# Patient Record
Sex: Male | Born: 1960
Health system: Southern US, Community
[De-identification: ages and names within clinical notes are randomized; demographics above are authoritative.]

## PROBLEM LIST (undated history)

## (undated) DIAGNOSIS — E119 Type 2 diabetes mellitus without complications: Secondary | ICD-10-CM

## (undated) DIAGNOSIS — F1910 Other psychoactive substance abuse, uncomplicated: Secondary | ICD-10-CM

## (undated) DIAGNOSIS — I10 Essential (primary) hypertension: Secondary | ICD-10-CM

## (undated) DIAGNOSIS — F1010 Alcohol abuse, uncomplicated: Secondary | ICD-10-CM

## (undated) DIAGNOSIS — D571 Sickle-cell disease without crisis: Secondary | ICD-10-CM

## (undated) HISTORY — PX: HYDROCELE EXCISION / REPAIR: SUR1145

## (undated) HISTORY — PX: BACK SURGERY: SHX140

---

## 2010-06-14 ENCOUNTER — Emergency Department (HOSPITAL_COMMUNITY)
Admission: EM | Admit: 2010-06-14 | Discharge: 2010-06-14 | Payer: Self-pay | Source: Home / Self Care | Admitting: Emergency Medicine

## 2010-06-17 LAB — BASIC METABOLIC PANEL
BUN: 6 mg/dL (ref 6–23)
CO2: 26 mEq/L (ref 19–32)
Calcium: 9.2 mg/dL (ref 8.4–10.5)
Chloride: 104 mEq/L (ref 96–112)
Creatinine, Ser: 0.9 mg/dL (ref 0.4–1.5)
GFR calc Af Amer: >60 mL/min (ref >60)
GFR calc non Af Amer: >60 mL/min (ref >60)
Glucose, Bld: 303 mg/dL — ABNORMAL HIGH (ref 70–99)
Potassium: 4.1 mEq/L (ref 3.5–5.1)
Sodium: 140 mEq/L (ref 135–145)

## 2010-06-17 LAB — GLUCOSE, CAPILLARY
Glucose-Capillary: 325 mg/dL — ABNORMAL HIGH (ref 70–99)
Glucose-Capillary: 184 mg/dL — ABNORMAL HIGH (ref 70–99)

## 2013-03-17 ENCOUNTER — Emergency Department (HOSPITAL_COMMUNITY)
Admission: EM | Admit: 2013-03-17 | Discharge: 2013-03-17 | Disposition: A | Discharge status: Home / Self Care | Payer: No Typology Code available for payment source | Attending: Emergency Medicine | Admitting: Emergency Medicine

## 2013-03-17 ENCOUNTER — Encounter (HOSPITAL_COMMUNITY): Payer: Self-pay | Admitting: Emergency Medicine

## 2013-03-17 ENCOUNTER — Emergency Department (HOSPITAL_COMMUNITY): Payer: No Typology Code available for payment source

## 2013-03-17 DIAGNOSIS — Y9389 Activity, other specified: Secondary | ICD-10-CM | POA: Insufficient documentation

## 2013-03-17 DIAGNOSIS — S0993XA Unspecified injury of face, initial encounter: Secondary | ICD-10-CM | POA: Insufficient documentation

## 2013-03-17 DIAGNOSIS — IMO0002 Reserved for concepts with insufficient information to code with codable children: Secondary | ICD-10-CM | POA: Insufficient documentation

## 2013-03-17 DIAGNOSIS — Z79899 Other long term (current) drug therapy: Secondary | ICD-10-CM | POA: Insufficient documentation

## 2013-03-17 DIAGNOSIS — E119 Type 2 diabetes mellitus without complications: Secondary | ICD-10-CM | POA: Insufficient documentation

## 2013-03-17 DIAGNOSIS — F17200 Nicotine dependence, unspecified, uncomplicated: Secondary | ICD-10-CM | POA: Insufficient documentation

## 2013-03-17 DIAGNOSIS — Y92410 Unspecified street and highway as the place of occurrence of the external cause: Secondary | ICD-10-CM | POA: Insufficient documentation

## 2013-03-17 HISTORY — DX: Type 2 diabetes mellitus without complications: E11.9

## 2013-03-17 MED ORDER — CYCLOBENZAPRINE HCL 10 MG PO TABS: 10.0000 mg | ORAL_TABLET | Freq: Two times a day (BID) | ORAL | Status: DC | PRN

## 2013-03-17 MED ORDER — IBUPROFEN 600 MG PO TABS: 600.0000 mg | ORAL_TABLET | Freq: Four times a day (QID) | ORAL | Status: DC | PRN

## 2013-03-17 MED ORDER — IBUPROFEN 400 MG PO TABS
600.0000 mg | ORAL_TABLET | Freq: Once | ORAL | Status: AC
  Administered 2013-03-17: 600 mg via ORAL
  Filled 2013-03-17 (×2): qty 1
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
## 2013-03-17 NOTE — ED Notes (Signed)
To x-ray

## 2013-03-17 NOTE — ED Notes (Signed)
The pt returned from xray 
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
## 2013-03-22 NOTE — ED Provider Notes (Signed)
Medical screening examination/treatment/procedure(s) were performed by non-physician practitioner and as supervising physician I was immediately available for consultation/collaboration.  Trinka Keshishyan Y. Dagan Heinz, MD 03/22/13 0043 

## 2013-07-22 ENCOUNTER — Emergency Department (HOSPITAL_COMMUNITY)
Admission: EM | Admit: 2013-07-22 | Discharge: 2013-07-22 | Disposition: A | Discharge status: Home / Self Care | Payer: Medicaid - Out of State | Attending: Emergency Medicine | Admitting: Emergency Medicine

## 2013-07-22 ENCOUNTER — Encounter (HOSPITAL_COMMUNITY): Payer: Self-pay | Admitting: Emergency Medicine

## 2013-07-22 DIAGNOSIS — R209 Unspecified disturbances of skin sensation: Secondary | ICD-10-CM | POA: Insufficient documentation

## 2013-07-22 DIAGNOSIS — Z79899 Other long term (current) drug therapy: Secondary | ICD-10-CM | POA: Insufficient documentation

## 2013-07-22 DIAGNOSIS — F17200 Nicotine dependence, unspecified, uncomplicated: Secondary | ICD-10-CM | POA: Insufficient documentation

## 2013-07-22 DIAGNOSIS — M79609 Pain in unspecified limb: Secondary | ICD-10-CM | POA: Insufficient documentation

## 2013-07-22 DIAGNOSIS — M79676 Pain in unspecified toe(s): Secondary | ICD-10-CM

## 2013-07-22 DIAGNOSIS — E119 Type 2 diabetes mellitus without complications: Secondary | ICD-10-CM | POA: Insufficient documentation

## 2013-07-22 LAB — CBG MONITORING, ED
Glucose-Capillary: 133 mg/dL — ABNORMAL HIGH (ref 70–99)
Glucose-Capillary: 151 mg/dL — ABNORMAL HIGH (ref 70–99)

## 2013-07-22 MED ORDER — METFORMIN HCL 500 MG PO TABS: 500.0000 mg | ORAL_TABLET | Freq: Two times a day (BID) | ORAL | Status: DC

## 2013-07-22 MED ORDER — TRAMADOL HCL 50 MG PO TABS: 50.0000 mg | ORAL_TABLET | Freq: Four times a day (QID) | ORAL | Status: DC | PRN

## 2013-07-22 NOTE — ED Notes (Signed)
Pt reports his R great toe has been hurting and hes worried its due to his diabetes. He ran out of his metformin last month.

## 2013-07-22 NOTE — ED Provider Notes (Signed)
CSN: [id number]     Arrival date & time 07/22/13  1026 History  This chart was scribed for non-physician practitioner, Renne CriglerJoshua Homar Weinkauf, PA-C, working with No att. providers found by Marica OtterNusrat Rahman, ED Scribe. This patient was seen in room TR05C/TR05C and the patient's care was started at 11:08 AM.   Chief Complaint  Patient presents with  . Toe Pain   The history is provided by the patient. No language interpreter was used.    HPI Comments: Stephen Bird is a 53 y.o. male who presents to the Emergency Department complaining of intermittent right great toe pain directly underneath the toenail that spreads to the tip of the toe. Pt describes pain as a "pins and needles" sensation. He states that he has had this pain for the past year. He reports that episodes can vary in length but usually last no more than 1 minute. He denies any recent injuries to his foot. He denies numbness or weakness in his right foot. There is no redness or drainage from the affected area. Patient denies fever. He has been taking his sister's gabapentin which does help.   Patient is also diabetic. Patient is currently on metformin for diabetes. However, patient has not been taking his metformin because he ran out of it last month. In its place, he has been taking Lantus which he has used in past. He has only been using lantus intermittently.   Pt. states he has been checking his sugar level at home. He reports he had sugar readings of 299 and 157 yesterday. Further, he reports that his sugar level was 160 a few days ago. He says that recently he had a low sugar of 60 after taking a dose of lantus and working for several hours, but he ate some candy and his sugar improved to 90. He is current smoker.  Past Medical History  Diagnosis Date  . Diabetes mellitus without complication    History reviewed. No pertinent past surgical history. History reviewed. No pertinent family history. History  Substance Use Topics  . Smoking  status: Current Every Day Smoker -- 0.50 packs/day    Types: Cigarettes  . Smokeless tobacco: Not on file  . Alcohol Use: Yes    Review of Systems  Constitutional: Negative for fever.  Musculoskeletal: Negative for arthralgias and joint swelling.       Positive for right great toe pain.  Skin: Negative for wound.  Neurological: Negative for weakness and numbness.      Allergies  Review of patient's allergies indicates no known allergies.  Home Medications   Current Outpatient Rx  Name  Route  Sig  Dispense  Refill  . cyclobenzaprine (FLEXERIL) 10 MG tablet   Oral   Take 1 tablet (10 mg total) by mouth 2 (two) times daily as needed for muscle spasms.   20 tablet   0   . ibuprofen (ADVIL,MOTRIN) 600 MG tablet   Oral   Take 1 tablet (600 mg total) by mouth every 6 (six) hours as needed for pain.   30 tablet   0   . metFORMIN (GLUCOPHAGE) 500 MG tablet   Oral   Take 500 mg by mouth 2 (two) times daily with a meal.          Triage Vitals: BP 153/106  Pulse 77  Temp(Src) 97.7 F (36.5 C) (Oral)  Resp 16  Ht 5\' 11"  (1.803 m)  Wt 163 lb 6.4 oz (74.118 kg)  BMI 22.80 kg/m2  SpO2 99% Physical  Exam  Nursing note and vitals reviewed. Constitutional: He appears well-developed and well-nourished. No distress.  HENT:  Head: Normocephalic and atraumatic.  Eyes: EOM are normal.  Neck: Neck supple. No tracheal deviation present.  Cardiovascular: Normal rate.   Pulmonary/Chest: Effort normal. No respiratory distress.  Musculoskeletal: Normal range of motion.  Normal ROM of right great toe. Normal capillary refill in right great toe. Right great toenail with fungal infection.   Neurological: He is alert.  Motor, sensation, and vascular distal to injury is fully intact.  Skin: Skin is warm and dry.  Psychiatric: He has a normal mood and affect. His behavior is normal.    ED Course  Procedures (including critical care time) DIAGNOSTIC STUDIES: Oxygen Saturation is 99%  on room air, normal by my interpretation.    COORDINATION OF CARE: 11:27 AM-Discussed treatment plan with pt at bedside and pt agreed to plan.    Labs Review Labs Reviewed  CBG MONITORING, ED - Abnormal; Notable for the following:    Glucose-Capillary 151 (*)    All other components within normal limits  CBG MONITORING, ED - Abnormal; Notable for the following:    Glucose-Capillary 133 (*)    All other components within normal limits   Vital signs reviewed and are as follows: Filed Vitals:   07/22/13 1059  BP: 153/106  Pulse: 77  Temp: 97.7 F (36.5 C)  Resp: 16   Patient counseled on use of narcotic pain medications. Counseled not to combine these medications with others containing tylenol. Urged not to drink alcohol, drive, or perform any other activities that requires focus while taking these medications. The patient verbalizes understanding and agrees with the plan.    MDM   Final diagnoses:  Toe pain   Toe pain, most likely due to diabetic neuropathy. There is no evidence of gout, infection, lumbar radiculopathy. Will refill the metformin to provide better control of blood sugar. PCP referrals given. Tramadol given for pain as needed.   I personally performed the services described in this documentation, which was scribed in my presence. The recorded information has been reviewed and is accurate.    Renne Crigler, PA-C 07/22/13 1148

## 2013-07-22 NOTE — Discharge Instructions (Signed)
Please read and follow all provided instructions.  Your diagnoses today include:  1. Toe pain     Tests performed today include:  Vital signs. See below for your results today.   Medications prescribed:   Metformin - medication for blood sugar control   Tramadol - narcotic-like pain medication  DO NOT drive or perform any activities that require you to be awake and alert because this medicine can make you drowsy.   Take any prescribed medications only as directed.  Home care instructions:  Follow any educational materials contained in this packet.  BE VERY CAREFUL not to take multiple medicines containing Tylenol (also called acetaminophen). Doing so can lead to an overdose which can damage your liver and cause liver failure and possibly death.   Follow-up instructions: Please follow-up with your primary care provider in the next 7 days for further evaluation of your symptoms. If you do not have a primary care doctor -- see below for referral information.   Return instructions:   Please return to the Emergency Department if you experience worsening symptoms.   Please return if you have any other emergent concerns.  Additional Information:  Your vital signs today were: BP 153/106   Pulse 77   Temp(Src) 97.7 F (36.5 C) (Oral)   Resp 16   Ht 5\' 11"  (1.803 m)   Wt 163 lb 6.4 oz (74.118 kg)   BMI 22.80 kg/m2   SpO2 99% If your blood pressure (BP) was elevated above 135/85 this visit, please have this repeated by your doctor within one month. --------------

## 2013-07-26 NOTE — ED Provider Notes (Signed)
Medical screening examination/treatment/procedure(s) were performed by non-physician practitioner and as supervising physician I was immediately available for consultation/collaboration.  EKG Interpretation   None         Prajna Vanderpool W. Alvetta Hidrogo, MD 07/26/13 0851 

## 2014-06-06 ENCOUNTER — Emergency Department (HOSPITAL_COMMUNITY)
Admission: EM | Admit: 2014-06-06 | Discharge: 2014-06-06 | Disposition: A | Discharge status: Home / Self Care | Payer: Medicaid - Out of State | Attending: Emergency Medicine | Admitting: Emergency Medicine

## 2014-06-06 ENCOUNTER — Encounter (HOSPITAL_COMMUNITY): Payer: Self-pay | Admitting: *Deleted

## 2014-06-06 DIAGNOSIS — E1165 Type 2 diabetes mellitus with hyperglycemia: Secondary | ICD-10-CM | POA: Diagnosis not present

## 2014-06-06 DIAGNOSIS — Z720 Tobacco use: Secondary | ICD-10-CM | POA: Insufficient documentation

## 2014-06-06 DIAGNOSIS — Z794 Long term (current) use of insulin: Secondary | ICD-10-CM | POA: Diagnosis not present

## 2014-06-06 DIAGNOSIS — R358 Other polyuria: Secondary | ICD-10-CM | POA: Diagnosis not present

## 2014-06-06 DIAGNOSIS — Z79899 Other long term (current) drug therapy: Secondary | ICD-10-CM | POA: Diagnosis not present

## 2014-06-06 DIAGNOSIS — F1910 Other psychoactive substance abuse, uncomplicated: Secondary | ICD-10-CM | POA: Diagnosis not present

## 2014-06-06 LAB — CBC WITH DIFFERENTIAL/PLATELET
Basophils Absolute: 0 10*3/uL (ref 0.0–0.1)
Basophils Relative: 0 % (ref 0–1)
EOS ABS: 0.3 10*3/uL (ref 0.0–0.7)
EOS PCT: 7 % — AB (ref 0–5)
HCT: 45.4 % (ref 39.0–52.0)
Hemoglobin: 15.9 g/dL (ref 13.0–17.0)
Lymphocytes Relative: 20 % (ref 12–46)
Lymphs Abs: 0.9 10*3/uL (ref 0.7–4.0)
MCH: 31.9 pg (ref 26.0–34.0)
MCHC: 35 g/dL (ref 30.0–36.0)
MCV: 91 fL (ref 78.0–100.0)
Monocytes Absolute: 0.4 10*3/uL (ref 0.1–1.0)
Monocytes Relative: 9 % (ref 3–12)
Neutro Abs: 2.7 10*3/uL (ref 1.7–7.7)
Neutrophils Relative %: 64 % (ref 43–77)
Platelets: 162 10*3/uL (ref 150–400)
RBC: 4.99 MIL/uL (ref 4.22–5.81)
RDW: 13.3 % (ref 11.5–15.5)
WBC: 4.2 10*3/uL (ref 4.0–10.5)

## 2014-06-06 LAB — COMPREHENSIVE METABOLIC PANEL
ALT: 22 U/L (ref 0–53)
AST: 21 U/L (ref 0–37)
Albumin: 3.9 g/dL (ref 3.5–5.2)
Alkaline Phosphatase: 78 U/L (ref 39–117)
Anion gap: 8 (ref 5–15)
BUN: 6 mg/dL (ref 6–23)
CO2: 31 mmol/L (ref 19–32)
Calcium: 9.4 mg/dL (ref 8.4–10.5)
Chloride: 95 mEq/L — ABNORMAL LOW (ref 96–112)
Creatinine, Ser: 1.14 mg/dL (ref 0.50–1.35)
GFR calc Af Amer: 84 mL/min — ABNORMAL LOW (ref >90)
GFR calc non Af Amer: 73 mL/min — ABNORMAL LOW (ref >90)
Glucose, Bld: 518 mg/dL — ABNORMAL HIGH (ref 70–99)
Potassium: 4.1 mmol/L (ref 3.5–5.1)
Sodium: 134 mmol/L — ABNORMAL LOW (ref 135–145)
Total Bilirubin: 1.1 mg/dL (ref 0.3–1.2)
Total Protein: 7.5 g/dL (ref 6.0–8.3)

## 2014-06-06 LAB — CBG MONITORING, ED
Glucose-Capillary: 456 mg/dL — ABNORMAL HIGH (ref 70–99)
Glucose-Capillary: 318 mg/dL — ABNORMAL HIGH (ref 70–99)
Glucose-Capillary: 248 mg/dL — ABNORMAL HIGH (ref 70–99)

## 2014-06-06 LAB — URINALYSIS, ROUTINE W REFLEX MICROSCOPIC
Bilirubin Urine: NEGATIVE
Glucose, UA: >1000 mg/dL — AB
HGB URINE DIPSTICK: NEGATIVE
Ketones, ur: 15 mg/dL — AB
Leukocytes, UA: NEGATIVE
Nitrite: NEGATIVE
Protein, ur: NEGATIVE mg/dL
SPECIFIC GRAVITY, URINE: 1.04 — AB (ref 1.005–1.030)
UROBILINOGEN UA: 0.2 mg/dL (ref 0.0–1.0)
pH: 6.5 (ref 5.0–8.0)

## 2014-06-06 LAB — LIPASE, BLOOD: Lipase: 65 U/L — ABNORMAL HIGH (ref 11–59)

## 2014-06-06 LAB — URINE MICROSCOPIC-ADD ON

## 2014-06-06 MED ORDER — SODIUM CHLORIDE 0.9 % IV BOLUS (SEPSIS)
1000.0000 mL | Freq: Once | INTRAVENOUS | Status: AC
  Administered 2014-06-06: 1000 mL via INTRAVENOUS

## 2014-06-06 MED ORDER — METFORMIN HCL 500 MG PO TABS: 500.0000 mg | ORAL_TABLET | Freq: Two times a day (BID) | ORAL | Status: DC

## 2014-06-06 MED ORDER — INSULIN GLARGINE 100 UNIT/ML ~~LOC~~ SOLN
3.0000 [IU] | Freq: Every day | SUBCUTANEOUS | Status: DC
Start: 2014-06-06 — End: 2015-06-09

## 2014-06-06 MED ORDER — SODIUM CHLORIDE 0.9 % IV BOLUS (SEPSIS)
500.0000 mL | Freq: Once | INTRAVENOUS | Status: AC
  Administered 2014-06-06: 500 mL via INTRAVENOUS

## 2014-06-06 NOTE — ED Notes (Signed)
Called lab and added on a Lipase to existing labs.

## 2014-06-06 NOTE — ED Notes (Signed)
CBG Taken = 248

## 2014-06-06 NOTE — ED Notes (Signed)
Pt states that he is a diabetic and has not been taking medications. Pt reports blurred vision and "just not feeling right." Pt states that he recently just cocaine and heroin "a couple days ago. Pt reports weight loss. Pt states that he is having "muscle twitching and pulling sensation" in his chest at times.

## 2014-06-06 NOTE — Discharge Instructions (Signed)
Blood Glucose Monitoring °Monitoring your blood glucose (also know as blood sugar) helps you to manage your diabetes. It also helps you and your health care provider monitor your diabetes and determine how well your treatment plan is working. °WHY SHOULD YOU MONITOR YOUR BLOOD GLUCOSE? °· It can help you understand how food, exercise, and medicine affect your blood glucose. °· It allows you to know what your blood glucose is at any given moment. You can quickly tell if you are having low blood glucose (hypoglycemia) or high blood glucose (hyperglycemia). °· It can help you and your health care provider know how to adjust your medicines. °· It can help you understand how to manage an illness or adjust medicine for exercise. °WHEN SHOULD YOU TEST? °Your health care provider will help you decide how often you should check your blood glucose. This may depend on the type of diabetes you have, your diabetes control, or the types of medicines you are taking. Be sure to write down all of your blood glucose readings so that this information can be reviewed with your health care provider. See below for examples of testing times that your health care provider may suggest. °Type 1 Diabetes °· Test 4 times a day if you are in good control, using an insulin pump, or perform multiple daily injections. °· If your diabetes is not well controlled or if you are sick, you may need to monitor more often. °· It is a good idea to also monitor: °· Before and after exercise. °· Between meals and 2 hours after a meal. °· Occasionally between 2:00 a.m. and 3:00 a.m. °Type 2 Diabetes °· It can vary with each person, but generally, if you are on insulin, test 4 times a day. °· If you take medicines by mouth (orally), test 2 times a day. °· If you are on a controlled diet, test once a day. °· If your diabetes is not well controlled or if you are sick, you may need to monitor more often. °HOW TO MONITOR YOUR BLOOD GLUCOSE °Supplies  Needed °· Blood glucose meter. °· Test strips for your meter. Each meter has its own strips. You must use the strips that go with your own meter. °· A pricking needle (lancet). °· A device that holds the lancet (lancing device). °· A journal or log book to write down your results. °Procedure °· Wash your hands with soap and water. Alcohol is not preferred. °· Prick the side of your finger (not the tip) with the lancet. °· Gently milk the finger until a small drop of blood appears. °· Follow the instructions that come with your meter for inserting the test strip, applying blood to the strip, and using your blood glucose meter. °Other Areas to Get Blood for Testing °Some meters allow you to use other areas of your body (other than your finger) to test your blood. These areas are called alternative sites. The most common alternative sites are: °· The forearm. °· The thigh. °· The back area of the lower leg. °· The palm of the hand. °The blood flow in these areas is slower. Therefore, the blood glucose values you get may be delayed, and the numbers are different from what you would get from your fingers. Do not use alternative sites if you think you are having hypoglycemia. Your reading will not be accurate. Always use a finger if you are having hypoglycemia. Also, if you cannot feel your lows (hypoglycemia unawareness), always use your fingers for your   blood glucose checks. °ADDITIONAL TIPS FOR GLUCOSE MONITORING °· Do not reuse lancets. °· Always carry your supplies with you. °· All blood glucose meters have a 24-hour "hotline" number to call if you have questions or need help. °· Adjust (calibrate) your blood glucose meter with a control solution after finishing a few boxes of strips. °BLOOD GLUCOSE RECORD KEEPING °It is a good idea to keep a daily record or log of your blood glucose readings. Most glucose meters, if not all, keep your glucose records stored in the meter. Some meters come with the ability to download  your records to your home computer. Keeping a record of your blood glucose readings is especially helpful if you are wanting to look for patterns. Make notes to go along with the blood glucose readings because you might forget what happened at that exact time. Keeping good records helps you and your health care provider to work together to achieve good diabetes management.  °Document Released: 05/22/2003 Document Revised: 10/03/2013 Document Reviewed: 10/11/2012 °ExitCare® Patient Information ©2015 [employer]. This information is not intended to replace advice given to you by your health care provider. Make sure you discuss any questions you have with your health care provider. °Hyperglycemia °Hyperglycemia occurs when the glucose (sugar) in your blood is too high. Hyperglycemia can happen for many reasons, but it most often happens to people who do not know they have diabetes or are not managing their diabetes properly.  °CAUSES  °Whether you have diabetes or not, there are other causes of hyperglycemia. Hyperglycemia can occur when you have diabetes, but it can also occur in other situations that you might not be as aware of, such as: °Diabetes °· If you have diabetes and are having problems controlling your blood glucose, hyperglycemia could occur because of some of the following reasons: °¨ Not following your meal plan. °¨ Not taking your diabetes medications or not taking it properly. °¨ Exercising less or doing less activity than you normally do. °¨ Being sick. °Pre-diabetes °· This cannot be ignored. Before people develop Type 2 diabetes, they almost always have "pre-diabetes." This is when your blood glucose levels are higher than normal, but not yet high enough to be diagnosed as diabetes. Research has shown that some long-term damage to the body, especially the heart and circulatory system, may already be occurring during pre-diabetes. If you take action to manage your blood glucose when you have  pre-diabetes, you may delay or prevent Type 2 diabetes from developing. °Stress °· If you have diabetes, you may be "diet" controlled or on oral medications or insulin to control your diabetes. However, you may find that your blood glucose is higher than usual in the hospital whether you have diabetes or not. This is often referred to as "stress hyperglycemia." Stress can elevate your blood glucose. This happens because of hormones put out by the body during times of stress. If stress has been the cause of your high blood glucose, it can be followed regularly by your caregiver. That way he/she can make sure your hyperglycemia does not continue to get worse or progress to diabetes. °Steroids °· Steroids are medications that act on the infection fighting system (immune system) to block inflammation or infection. One side effect can be a rise in blood glucose. Most people can produce enough extra insulin to allow for this rise, but for those who cannot, steroids make blood glucose levels go even higher. It is not unusual for steroid treatments to "uncover" diabetes that   that is developing. It is not always possible to determine if the hyperglycemia will go away after the steroids are stopped. A special blood test called an A1c is sometimes done to determine if your blood glucose was elevated before the steroids were started. SYMPTOMS  Thirsty.  Frequent urination.  Dry mouth.  Blurred vision.  Tired or fatigue.  Weakness.  Sleepy.  Tingling in feet or leg. DIAGNOSIS  Diagnosis is made by monitoring blood glucose in one or all of the following ways:  A1c test. This is a chemical found in your blood.  Fingerstick blood glucose monitoring.  Laboratory results. TREATMENT  First, knowing the cause of the hyperglycemia is important before the hyperglycemia can be treated. Treatment may include, but is not be limited to:  Education.  Change or adjustment in medications.  Change or adjustment in  meal plan.  Treatment for an illness, infection, etc.  More frequent blood glucose monitoring.  Change in exercise plan.  Decreasing or stopping steroids.  Lifestyle changes. HOME CARE INSTRUCTIONS   Test your blood glucose as directed.  Exercise regularly. Your caregiver will give you instructions about exercise. Pre-diabetes or diabetes which comes on with stress is helped by exercising.  Eat wholesome, balanced meals. Eat often and at regular, fixed times. Your caregiver or nutritionist will give you a meal plan to guide your sugar intake.  Being at an ideal weight is important. If needed, losing as little as 10 to 15 pounds may help improve blood glucose levels. SEEK MEDICAL CARE IF:   You have questions about medicine, activity, or diet.  You continue to have symptoms (problems such as increased thirst, urination, or weight gain). SEEK IMMEDIATE MEDICAL CARE IF:   You are vomiting or have diarrhea.  Your breath smells fruity.  You are breathing faster or slower.  You are very sleepy or incoherent.  You have numbness, tingling, or pain in your feet or hands.  You have chest pain.  Your symptoms get worse even though you have been following your caregiver's orders.  If you have any other questions or concerns. Document Released: 11/12/2000 Document Revised: 08/11/2011 Document Reviewed: 09/15/2011 [facility] Patient Information 2015 [place], [place]. This information is not intended to replace advice given to you by your health care provider. Make sure you discuss any questions you have with your health care provider.   Emergency Department Resource Guide 1) Find a Doctor and Pay Out of Pocket Although you won't have to find out who is covered by your insurance plan, it is a good idea to ask around and get recommendations. You will then need to call the office and see if the doctor you have chosen will accept you as a new patient and what types of options they offer  for patients who are self-pay. Some doctors offer discounts or will set up payment plans for their patients who do not have insurance, but you will need to ask so you aren't surprised when you get to your appointment.  2) Contact Your Local Health Department Not all health departments have doctors that can see patients for sick visits, but many do, so it is worth a call to see if yours does. If you don't know where your local health department is, you can check in your phone book. The CDC also has a tool to help you locate your state's health department, and many state websites also have listings of all of their local health departments.  3) Find a Walk-in Clinic If  your illness is not likely to be very severe or complicated, you may want to try a walk in clinic. These are popping up all over the country in pharmacies, drugstores, and shopping centers. They're usually staffed by nurse practitioners or physician assistants that have been trained to treat common illnesses and complaints. They're usually fairly quick and inexpensive. However, if you have serious medical issues or chronic medical problems, these are probably not your best option.  No Primary Care Doctor: - Call Health Connect at  [phone] - they can help you locate a primary care doctor that  accepts your insurance, provides certain services, etc. - Physician Referral Service- [phone]  Chronic Pain Problems: Organization         Address  Phone   Notes  [facility]  [phone] Patients need to be referred by their primary care doctor.   Medication Assistance: Organization         Address  Phone   Notes  [facility] [place] Medication [facility] [address] [place], [place] [address] [phone]) [phone] --Must be a resident of [facility] [facility] [place] -- Must have NO insurance coverage whatsoever (no Medicaid/ Medicare, etc.) -- The pt. MUST have a primary care doctor that directs their care  regularly and follows them in the community   MedAssist  [phone]   [employer]  [phone]    Agencies that provide inexpensive medical care: Organization         Address  Phone   Notes  [facility]  [phone]   [facility]    [phone]   [facility] [address] [place], [place] [address] [phone]   [facility] [address], [place] [phone]   Planned Parenthood    [phone]) [phone]   [facility]    [phone]   [facility]  [address], [place] Phone:  [phone]) [phone], Fax:  [phone] Hours of Operation:  9 am - 6 pm, M-F.  Also accepts Medicaid/Medicare and self-pay.  [facility]  [address], [place] Phone: [phone], Fax: [phone]) [phone]. Hours of Operation:  8:30 am - 5:30 pm, M-F.  Also accepts Medicaid and self-pay.  [facility] [place] [address], [place] Phone: [phone]) [phone]   [facility] [address], [place], [place] [phone], Ext. [phone] Mondays & Thursdays: 7-9 AM.  First 15 patients are seen on a first come, first serve basis.    Medicaid-accepting [facility] [place] Providers:  Organization         Address  Phone   Notes  [facility] [address], [place] [phone] Also accepts self-pay patients.  [facility] [address] [place], [place]  [phone]   [facility] [address], [place] [phone]   [facility] [address] [place] [address], [place] [phone]   Renaye RakersVeita Bland [address], [place]   [phone]) [phone] Only accepts [place] Access [place] patients after they have their name applied to their card.   Self-Pay (no insurance) in [facility] [place]:  Organization         Address  Phone    Notes  Sickle Cell Patients, [facility] [address], [place] [phone]) [phone]  [facility] [phone]   [name] [place]  [place], [address], [place] [phone]   [facility]/Dr. Osei-Bonsu  [address], [place] [address], [place] [phone] Phone number for both [place] and [place] locations is the same.  [facility] [place] [address] [place] [phone]   [facility] [address], [place] or [address] [phone]   [facility] [address], [place] [phone], phone; [phone], fax Sees patients 1st and 3rd Saturday of every month.  Must not qualify for public or private insurance (i.e. Medicaid, Medicare, [place] Health Choice, Veterans' Benefits)  Household income should be no more than 200% of the poverty level The clinic cannot treat you if you are pregnant or think you are pregnant  Sexually transmitted diseases are not treated at the clinic.    Dental Care: Organization         Address  Phone  Notes  [facility] Department of [facility] [place] [phone] Accepts children up to age 73 who are enrolled in [place] or [place]; pregnant women with a Medicaid card; and children who have applied for Medicaid or [place] Health Choice, but were declined, whose parents can pay a reduced fee at time of service.  [facility]  [address], [place] [phone] Accepts children up to age 31 who are enrolled in [place] or [place]; pregnant women with a Medicaid card; and children who have applied for Medicaid or [place] Health Choice, but were declined, whose parents can pay a reduced fee at time of service.  [place]  Adult Dental Access PROGRAM  [place] [phone] Patients are seen by appointment only. Walk-ins are not accepted. [place] will see patients 75 years of age and older. Monday - Tuesday (8am-5pm) Most Wednesdays (8:30-5pm) $30 per visit, cash only  [facility] Adult Dental Access PROGRAM  [address], [facility] [phone] Patients are seen by appointment only. Walk-ins are not accepted. [place] will see patients 54 years of age and older. One Wednesday Evening (Monthly: Volunteer Based).  $30 per visit, cash only  [place]  [phone] for adults; Children under age 41, call Graduate Pediatric Dentistry at [phone]. Children aged 61-14, please call [phone] to request a pediatric application.  Dental services are provided in all areas of dental care including fillings, crowns and bridges, complete and partial dentures, implants, gum treatment, root canals, and extractions. Preventive care is also provided. Treatment is provided to both adults and children. Patients are selected via a lottery and there is often a waiting list.   [facility] [address] [place]  [phone] [email or web address]   [facility] [address] [place], [place] [phone], Ext. [phone] Second and Fourth Thursday of each month, opens at 6:30 AM; Clinic ends at 9 AM.  Patients are seen on a first-come first-served basis, and a limited number are seen during each clinic.   [facility]  [address] Hillard Danker [place], [place] [phone]   Eligibility Requirements You must have lived in [place], [place], or [place] counties for at least the last  three months.   You cannot be eligible for state or federal sponsored [employer], including [employer], [place], or [employer].   You generally cannot be eligible for healthcare insurance through your employer.    How to  apply: Eligibility screenings are held every Tuesday and Wednesday afternoon from 1:00 pm until 4:00 pm. You do not need an appointment for the interview!  [facility] [address], [place], [place]   [place]  [place] Department  [place]  [phone]    Behavioral Health Resources in the Community: Intensive Outpatient Programs Organization         Address  Phone  Notes  [place]. [address], [place], [place] [phone]   [facility] Outpatient [address] [place], [place]   ADS: Alcohol & Drug Svcs [address] [place], [place]   [place] [address],  [place], [place] or [phone]   Substance Abuse Resources Organization         Address  Phone  Notes  Alcohol and Drug Services  [phone]   [place]  [phone]   The [place]   Chinita Pester  [phone]   Residential & Outpatient Substance Abuse Program  [phone]   Psychological Services Organization         Address  Phone  Notes  [facility] [place]  [place]  [phone]   [place] [address] [place] or [phone]    Mobile Crisis Teams Organization         Address  Phone  Notes  [facility], Mobile Crisis Care Unit  [phone]   [facility]  [address] [place], [place]   [place] [address] [place] [phone]    Self-Help/Support Groups Organization         Address  Phone             Notes  [place]. of [place] - variety of support groups  [place] Call for more information  Narcotics Anonymous (NA), [facility]aring [facility] [address], [employer] [place]  2 meetings at this location   Special educational needs teacher         Address  Phone  Notes  ASAP Residential Treatment [place],    [place]  [phone]   [facility]  [address], [place] [address], [place], [place]   [place], [place] [phone] Admissions: 8am-3pm M-F  Incentives Substance [place] [address],    [place], [place] [phone]   [facility] [address] [place], [place], [place]   [facility] [address]  [place], [place]   Insight Programs - Intensive Outpatient [address], [place], [place]   [facility] ([place].,  [place] [employer], [place] [phone] or [phone]   [facility] Services (RTS) [address], [place], [place] Accepts Medicaid  [facility] [address]  [place] [phone] Substance Abuse/Addiction Treatment   [facility] Organization         Address  Phone  Notes  [facility]  [phone]   [facility] Free  Karna Dupes, PhD [address] Arlis Porta [place], [place]   [phone] or [phone]   [facility] [place], [place] [phone]   [address], [place], [place] [phone] Insurance/Medicaid/sponsorship through [facility] and Families [address], [address] [place]                                    [place], [place] [phone] [place] [address], [place] [phone]    Dr. Adele Schilder  [phone]   [facility] Dept. 1) [address] [place] 2) [place] 3)  [place] [address], [place] [phone]  [phone]   [place]  [phone] or [phone] (After Hours)

## 2014-06-06 NOTE — ED Provider Notes (Signed)
CSN: [id number]     Arrival date & time 06/06/14  1015 History   First MD Initiated Contact with Patient 06/06/14 1021     Chief Complaint  Patient presents with  . Blood Sugar Problem   HPI  Patient is a 54 year old male who presents emergency room for evaluation of hyperglycemia. Patient states that he has been a diabetic for quite some time and was supposed to be taking metformin and Lantus 3 units per night, but has not been consistently taking his medication for the last 3 months. Patient states that he occasionally will take his sister's metformin or was taking Lantus incorrectly. He states that he checked his blood sugar 2 days ago and it was 400. Patient states that he has been feeling kind of strange like his vision is intermittently blurry and also having tingling and numbness in his hands. Patient states that prior to going off his medications his blood sugar was usually between 101 25. Patient also states that he has been drinking a lot, has been doing a lot of cocaine, and has been doing heroin. He states that he smokes frequently. He admits to some intermittent chest pain in the past. He does not have any chest pain today. Patient states that he would like to be set up with a doctor so that he can have somebody prescribed his medications to him.  Past Medical History  Diagnosis Date  . Diabetes mellitus without complication    Past Surgical History  Procedure Laterality Date  . Back surgery     No family history on file. History  Substance Use Topics  . Smoking status: Current Every Day Smoker -- 0.50 packs/day    Types: Cigarettes  . Smokeless tobacco: Not on file  . Alcohol Use: Yes     Comment: "alot"    Review of Systems  Constitutional: Positive for fatigue and unexpected weight change. Negative for fever and chills.  Eyes: Positive for visual disturbance.  Respiratory: Negative for chest tightness and shortness of breath.   Cardiovascular: Negative for chest pain  and palpitations.  Gastrointestinal: Negative for nausea, vomiting, abdominal pain, diarrhea and constipation.  Endocrine: Positive for polydipsia, polyphagia and polyuria.  Genitourinary: Positive for frequency. Negative for dysuria, urgency, hematuria and difficulty urinating.  Skin: Negative for rash.  All other systems reviewed and are negative.     Allergies  Review of patient's allergies indicates no known allergies.  Home Medications   Prior to Admission medications   Medication Sig Start Date End Date Taking? Authorizing Provider  ibuprofen (ADVIL,MOTRIN) 600 MG tablet Take 1 tablet (600 mg total) by mouth every 6 (six) hours as needed for pain. 03/17/13  Yes Simmie Davies, NP  metFORMIN (GLUCOPHAGE) 500 MG tablet Take 1 tablet (500 mg total) by mouth 2 (two) times daily with a meal. 07/22/13  Yes Renne Crigler, PA-C  cyclobenzaprine (FLEXERIL) 10 MG tablet Take 1 tablet (10 mg total) by mouth 2 (two) times daily as needed for muscle spasms. Patient not taking: Reported on 06/06/2014 03/17/13   Simmie Davies, NP  insulin glargine (LANTUS) 100 UNIT/ML injection Inject 0.03 mLs (3 Units total) into the skin at bedtime. 06/06/14   Kalii Chesmore A Forcucci, PA-C  metFORMIN (GLUCOPHAGE) 500 MG tablet Take 1 tablet (500 mg total) by mouth 2 (two) times daily with a meal. 06/06/14   Jaymie Misch A Forcucci, PA-C  traMADol (ULTRAM) 50 MG tablet Take 1 tablet (50 mg total) by mouth every 6 (six) hours as  needed. Patient not taking: Reported on 06/06/2014 07/22/13   Renne Crigler, PA-C   BP 136/97 mmHg  Pulse 76  Temp(Src) 98.7 F (37.1 C) (Oral)  Resp 14  Ht  (1.803 m)  Wt 140 lb (63.504 kg)  BMI 19.53 kg/m2  SpO2 98% Physical Exam  Constitutional: He is oriented to person, place, and time. He appears well-developed and well-nourished. No distress.  HENT:  Head: Normocephalic and atraumatic.  Mouth/Throat: Oropharynx is clear and moist. No oropharyngeal exudate.  Eyes: Conjunctivae and  EOM are normal. Pupils are equal, round, and reactive to light. No scleral icterus.  Neck: Normal range of motion. Neck supple. No JVD present. No thyromegaly present.  Cardiovascular: Normal rate, regular rhythm, normal heart sounds and intact distal pulses.  Exam reveals no gallop and no friction rub.   No murmur heard. Pulmonary/Chest: Effort normal and breath sounds normal. No respiratory distress. He has no wheezes. He has no rales. He exhibits no tenderness.  Abdominal: Soft. Normal appearance and bowel sounds are normal. He exhibits no distension and no mass. There is tenderness in the epigastric area. There is no rebound and no guarding.  Musculoskeletal: Normal range of motion.  Lymphadenopathy:    He has no cervical adenopathy.  Neurological: He is alert and oriented to person, place, and time. He has normal strength. No cranial nerve deficit or sensory deficit.  Skin: Skin is warm and dry. He is not diaphoretic.  Psychiatric: He has a normal mood and affect. His behavior is normal. Judgment and thought content normal.  Nursing note and vitals reviewed.   ED Course  Procedures (including critical care time) Labs Review Labs Reviewed  CBC WITH DIFFERENTIAL - Abnormal; Notable for the following:    Eosinophils Relative 7 (*)    All other components within normal limits  COMPREHENSIVE METABOLIC PANEL - Abnormal; Notable for the following:    Sodium 134 (*)    Chloride 95 (*)    Glucose, Bld 518 (*)    GFR calc non Af Amer 73 (*)    GFR calc Af Amer 84 (*)    All other components within normal limits  URINALYSIS, ROUTINE W REFLEX MICROSCOPIC - Abnormal; Notable for the following:    Specific Gravity, Urine 1.040 (*)    Glucose, UA >1000 (*)    Ketones, ur 15 (*)    All other components within normal limits  LIPASE, BLOOD - Abnormal; Notable for the following:    Lipase 65 (*)    All other components within normal limits  URINE MICROSCOPIC-ADD ON - Abnormal; Notable for the  following:    Squamous Epithelial / LPF FEW (*)    All other components within normal limits  CBG MONITORING, ED - Abnormal; Notable for the following:    Glucose-Capillary 456 (*)    All other components within normal limits  CBG MONITORING, ED - Abnormal; Notable for the following:    Glucose-Capillary 318 (*)    All other components within normal limits  CBG MONITORING, ED    Imaging Review No results found.   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 Physical exam is unremarkable. Patient was hyperglycemic with a blood sugar of 518. There was no anion gap. There is minimal tenting urine. Patient is alert and oriented at this time. This is not DKA. There is no signs of infection in the urine. Lipase is mildly elevated. CBG decreased to the 300s after 1 L normal saline bolus. Patient given another 500 mL. Patient stable for discharge at this time. 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 We will discharge home with prescriptions for metformin and Lantus. [facility]. I advised stopping smoking, cocaine, heroin, and alcohol. He states understanding and agreement. I have given the resource list which has information to stop smoking, and polysubstance abuse if he is interested. Patient stable for discharge at this time. I have discussed the patient with Dr. Juleen ChinaKohut who agrees with the above workup and plan.  Eben Burowourtney A Forcucci, PA-C 06/06/14  1359  Raeford RazorStephen Kohut, MD 06/07/14 [id number]

## 2014-12-21 ENCOUNTER — Emergency Department (HOSPITAL_COMMUNITY)
Admission: EM | Admit: 2014-12-21 | Discharge: 2014-12-21 | Disposition: A | Discharge status: Home / Self Care | Payer: Medicaid - Out of State | Attending: Emergency Medicine | Admitting: Emergency Medicine

## 2014-12-21 ENCOUNTER — Encounter (HOSPITAL_COMMUNITY): Payer: Self-pay

## 2014-12-21 DIAGNOSIS — Y9289 Other specified places as the place of occurrence of the external cause: Secondary | ICD-10-CM | POA: Insufficient documentation

## 2014-12-21 DIAGNOSIS — T148XXA Other injury of unspecified body region, initial encounter: Secondary | ICD-10-CM

## 2014-12-21 DIAGNOSIS — Z720 Tobacco use: Secondary | ICD-10-CM | POA: Insufficient documentation

## 2014-12-21 DIAGNOSIS — W06XXXA Fall from bed, initial encounter: Secondary | ICD-10-CM | POA: Insufficient documentation

## 2014-12-21 DIAGNOSIS — Z794 Long term (current) use of insulin: Secondary | ICD-10-CM | POA: Insufficient documentation

## 2014-12-21 DIAGNOSIS — R739 Hyperglycemia, unspecified: Secondary | ICD-10-CM

## 2014-12-21 DIAGNOSIS — S060X0A Concussion without loss of consciousness, initial encounter: Secondary | ICD-10-CM | POA: Insufficient documentation

## 2014-12-21 DIAGNOSIS — R42 Dizziness and giddiness: Secondary | ICD-10-CM | POA: Insufficient documentation

## 2014-12-21 DIAGNOSIS — Y998 Other external cause status: Secondary | ICD-10-CM | POA: Insufficient documentation

## 2014-12-21 DIAGNOSIS — E1165 Type 2 diabetes mellitus with hyperglycemia: Secondary | ICD-10-CM | POA: Insufficient documentation

## 2014-12-21 DIAGNOSIS — I10 Essential (primary) hypertension: Secondary | ICD-10-CM | POA: Insufficient documentation

## 2014-12-21 DIAGNOSIS — Y9389 Activity, other specified: Secondary | ICD-10-CM | POA: Insufficient documentation

## 2014-12-21 DIAGNOSIS — Z79899 Other long term (current) drug therapy: Secondary | ICD-10-CM | POA: Insufficient documentation

## 2014-12-21 HISTORY — DX: Essential (primary) hypertension: I10

## 2014-12-21 LAB — BASIC METABOLIC PANEL
ANION GAP: 10 (ref 5–15)
BUN: 17 mg/dL (ref 6–20)
CALCIUM: 9.5 mg/dL (ref 8.9–10.3)
CO2: 26 mmol/L (ref 22–32)
CREATININE: 1.39 mg/dL — AB (ref 0.61–1.24)
Chloride: 97 mmol/L — ABNORMAL LOW (ref 101–111)
GFR, EST NON AFRICAN AMERICAN: 57 mL/min — AB (ref >60)
Glucose, Bld: 484 mg/dL — ABNORMAL HIGH (ref 65–99)
Potassium: 4.1 mmol/L (ref 3.5–5.1)
SODIUM: 133 mmol/L — AB (ref 135–145)

## 2014-12-21 LAB — CBC WITH DIFFERENTIAL/PLATELET
Basophils Absolute: 0 10*3/uL (ref 0.0–0.1)
Basophils Relative: 0 % (ref 0–1)
Eosinophils Absolute: 0.6 10*3/uL (ref 0.0–0.7)
Eosinophils Relative: 14 % — ABNORMAL HIGH (ref 0–5)
HCT: 42.7 % (ref 39.0–52.0)
Hemoglobin: 15.1 g/dL (ref 13.0–17.0)
Lymphocytes Relative: 34 % (ref 12–46)
Lymphs Abs: 1.4 10*3/uL (ref 0.7–4.0)
MCH: 31.5 pg (ref 26.0–34.0)
MCHC: 35.4 g/dL (ref 30.0–36.0)
MCV: 89.1 fL (ref 78.0–100.0)
Monocytes Absolute: 0.3 10*3/uL (ref 0.1–1.0)
Monocytes Relative: 8 % (ref 3–12)
Neutro Abs: 1.8 10*3/uL (ref 1.7–7.7)
Neutrophils Relative %: 44 % (ref 43–77)
Platelets: 172 10*3/uL (ref 150–400)
RBC: 4.79 MIL/uL (ref 4.22–5.81)
RDW: 12.7 % (ref 11.5–15.5)
WBC: 4.2 10*3/uL (ref 4.0–10.5)

## 2014-12-21 LAB — CBG MONITORING, ED
Glucose-Capillary: 220 mg/dL — ABNORMAL HIGH (ref 65–99)
Glucose-Capillary: 443 mg/dL — ABNORMAL HIGH (ref 65–99)

## 2014-12-21 MED ORDER — INSULIN ASPART 100 UNIT/ML ~~LOC~~ SOLN
10.0000 [IU] | Freq: Once | SUBCUTANEOUS | Status: AC
  Administered 2014-12-21: 10 [IU] via INTRAVENOUS
  Filled 2014-12-21: qty 1

## 2014-12-21 MED ORDER — HYDROCHLOROTHIAZIDE 12.5 MG PO TABS: 12.5000 mg | ORAL_TABLET | Freq: Every day | ORAL | Status: DC

## 2014-12-21 MED ORDER — SODIUM CHLORIDE 0.9 % IV BOLUS (SEPSIS)
1000.0000 mL | Freq: Once | INTRAVENOUS | Status: AC
  Administered 2014-12-21: 1000 mL via INTRAVENOUS

## 2014-12-21 MED ORDER — METFORMIN HCL 500 MG PO TABS: 500.0000 mg | ORAL_TABLET | Freq: Two times a day (BID) | ORAL | Status: DC

## 2014-12-21 NOTE — ED Provider Notes (Signed)
CSN: [id number]   Arrival date & time 12/21/14 0017  History  This chart was scribed for  Derwood Kaplan, MD by Bethel Born, ED Scribe. This patient was seen in room A05C/A05C and the patient's care was started at 1:56 AM.  Chief Complaint  Patient presents with  . Multiple Complaints     HPI The history is provided by the patient. No language interpreter was used.   Stephen Bird is a 54 y.o. male who presents to the Emergency Department for a medication refill. He has been out of his Metformin and HTN medication for 1 month. He is unsure what antihypertensive medication he was on but it may have included hydrochlorothiazide. His last prescription was written in the ED and he does not have regular follow-up. His blood sugar was 443 at home today. Pt also complains of a "bump" at the posterior right head after falling from his bed and hitting his head on a night stand yesterday afternoon. The pt was asleep when he fell.  Associated symptoms include light-headed dizziness and "seeing floaters". No LOC. No current headache.   Past Medical History  Diagnosis Date  . Diabetes mellitus without complication   . Hypertension     Past Surgical History  Procedure Laterality Date  . Back surgery      No family history on file.  History  Substance Use Topics  . Smoking status: Current Every Day Smoker -- 0.50 packs/day    Types: Cigarettes  . Smokeless tobacco: Not on file  . Alcohol Use: Yes     Comment: "alot"     Review of Systems  HENT:       "Bump" at the posterior right head  Neurological: Positive for dizziness. Negative for loss of consciousness and headaches.  All other systems reviewed and are negative.   Home Medications   Prior to Admission medications   Medication Sig Start Date End Date Taking? Authorizing Provider  cyclobenzaprine (FLEXERIL) 10 MG tablet Take 1 tablet (10 mg total) by mouth 2 (two) times daily as needed for muscle spasms. Patient not taking: Reported  on 06/06/2014 03/17/13   Isaias Sakai, NP  hydrochlorothiazide (HYDRODIURIL) 12.5 MG tablet Take 1 tablet (12.5 mg total) by mouth daily. 12/21/14   Derwood Kaplan, MD  ibuprofen (ADVIL,MOTRIN) 600 MG tablet Take 1 tablet (600 mg total) by mouth every 6 (six) hours as needed for pain. Patient not taking: Reported on 12/21/2014 03/17/13   Isaias Sakai, NP  insulin glargine (LANTUS) 100 UNIT/ML injection Inject 0.03 mLs (3 Units total) into the skin at bedtime. Patient not taking: Reported on 12/21/2014 06/06/14   Terri Piedra, PA-C  metFORMIN (GLUCOPHAGE) 500 MG tablet Take 1 tablet (500 mg total) by mouth 2 (two) times daily with a meal. 12/21/14   Derwood Kaplan, MD  traMADol (ULTRAM) 50 MG tablet Take 1 tablet (50 mg total) by mouth every 6 (six) hours as needed. Patient not taking: Reported on 06/06/2014 07/22/13   Renne Crigler, PA-C    Allergies  Review of patient's allergies indicates no known allergies.  Triage Vitals: BP 138/92 mmHg  Pulse 97  Temp(Src) 98.1 F (36.7 C) (Oral)  Resp 18  Ht 5\' 11"  (1.803 m)  Wt 150 lb 3 oz (68.125 kg)  BMI 20.96 kg/m2  SpO2 95%  Physical Exam  Constitutional: He is oriented to person, place, and time. He appears well-developed and well-nourished. No distress.  HENT:  Head: Normocephalic and atraumatic.  Eyes: Conjunctivae and EOM are normal.  Neck: Neck supple. No tracheal deviation present.  Musculoskeletal: Normal range of motion.  Right parietal region, and region behind the ear has hematoma and tenderness to palpation  Neurological: He is alert and oriented to person, place, and time. No cranial nerve deficit. Coordination normal.  Cranial nerves 2-12 intact Pupils 3 mm and equal Cerebellar exam is normal  4+/5 bilateral upper and lower extremity strength Normal sensory exam  Skin: Skin is warm and dry.  Psychiatric: He has a normal mood and affect. His behavior is normal.  Nursing note and vitals reviewed.   ED Course   Procedures   DIAGNOSTIC STUDIES: Oxygen Saturation is 95% on RA, normal by my interpretation.    COORDINATION OF CARE: 2:00 AM Discussed treatment plan which includes lab work and IVF with pt at bedside and pt agreed to plan.  Labs Review-  Labs Reviewed  CBC WITH DIFFERENTIAL/PLATELET - Abnormal; Notable for the following:    Eosinophils Relative 14 (*)    All other components within normal limits  BASIC METABOLIC PANEL - Abnormal; Notable for the following:    Sodium 133 (*)    Chloride 97 (*)    Glucose, Bld 484 (*)    Creatinine, Ser 1.39 (*)    GFR calc non Af Amer 57 (*)    All other components within normal limits  CBG MONITORING, ED - Abnormal; Notable for the following:    Glucose-Capillary 443 (*)    All other components within normal limits  CBG MONITORING, ED - Abnormal; Notable for the following:    Glucose-Capillary 220 (*)    All other components within normal limits    Imaging Review No results found.  EKG Interpretation None      MDM   Final diagnoses:  Hyperglycemia without ketosis  Contusion  Concussion, without loss of consciousness, initial encounter     I personally performed the services described in this documentation, which was scribed in my presence. The recorded information has been reviewed and is accurate.  Pt comes in for refills. He had some headache after he fell from his bed and hit the table at 4 pm. No focal neuro deficits currently, and the injury happened 10 hours ago - dont think a CT is needed. He had some dizziness post trauma - but states he is not having any dizziness currently. Will give him refills on his meds. [facility] f/u provided.     Derwood Kaplan, MD 12/21/14 [phone]

## 2014-12-21 NOTE — ED Notes (Signed)
Pt reports metformin  BID. Reports he was taking something in [place] for HTN but is unsure of name and no longer has bottle.  Reports sometimes he has a "humming sensation all through his whole person."

## 2014-12-21 NOTE — ED Notes (Signed)
Pt reports he is here today because he ran out of his diabetes and HTN medications. He fell out of his bed this afternoon and the right side of head hit the night stand (no blood thinners, no LOC). He also reports blurry vision but this has been an ongoing issue for him. No acute distress, ambulatory at triage, denies CP.

## 2014-12-21 NOTE — Discharge Instructions (Signed)
Please see the [facility] doctor for optimal control of the BP and the sugars.   Concussion A concussion, or closed-head injury, is a brain injury caused by a direct blow to the head or by a quick and sudden movement (jolt) of the head or neck. Concussions are usually not life-threatening. Even so, the effects of a concussion can be serious. If you have had a concussion before, you are more likely to experience concussion-like symptoms after a direct blow to the head.  CAUSES  Direct blow to the head, such as from running into another player during a soccer game, being hit in a fight, or hitting your head on a hard surface.  A jolt of the head or neck that causes the brain to move back and forth inside the skull, such as in a car crash. SIGNS AND SYMPTOMS The signs of a concussion can be hard to notice. Early on, they may be missed by you, family members, and health care providers. You may look fine but act or feel differently. Symptoms are usually temporary, but they may last for days, weeks, or even longer. Some symptoms may appear right away while others may not show up for hours or days. Every head injury is different. Symptoms include:  Mild to moderate headaches that will not go away.  A feeling of pressure inside your head.  Having more trouble than usual:  Learning or remembering things you have heard.  Answering questions.  Paying attention or concentrating.  Organizing daily tasks.  Making decisions and solving problems.  Slowness in thinking, acting or reacting, speaking, or reading.  Getting lost or being easily confused.  Feeling tired all the time or lacking energy (fatigued).  Feeling drowsy.  Sleep disturbances.  Sleeping more than usual.  Sleeping less than usual.  Trouble falling asleep.  Trouble sleeping (insomnia).  Loss of balance or feeling lightheaded or dizzy.  Nausea or vomiting.  Numbness or tingling.  Increased sensitivity  to:  Sounds.  Lights.  Distractions.  Vision problems or eyes that tire easily.  Diminished sense of taste or smell.  Ringing in the ears.  Mood changes such as feeling sad or anxious.  Becoming easily irritated or angry for little or no reason.  Lack of motivation.  Seeing or hearing things other people do not see or hear (hallucinations). DIAGNOSIS Your health care provider can usually diagnose a concussion based on a description of your injury and symptoms. He or she will ask whether you passed out (lost consciousness) and whether you are having trouble remembering events that happened right before and during your injury. Your evaluation might include:  A brain scan to look for signs of injury to the brain. Even if the test shows no injury, you may still have a concussion.  Blood tests to be sure other problems are not present. TREATMENT  Concussions are usually treated in an emergency department, in urgent care, or at a clinic. You may need to stay in the hospital overnight for further treatment.  Tell your health care provider if you are taking any medicines, including prescription medicines, over-the-counter medicines, and natural remedies. Some medicines, such as blood thinners (anticoagulants) and aspirin, may increase the chance of complications. Also tell your health care provider whether you have had alcohol or are taking illegal drugs. This information may affect treatment.  Your health care provider will send you home with important instructions to follow.  How fast you will recover from a concussion depends on many  factors. These factors include how severe your concussion is, what part of your brain was injured, your age, and how healthy you were before the concussion.  Most people with mild injuries recover fully. Recovery can take time. In general, recovery is slower in older persons. Also, persons who have had a concussion in the past or have other medical  problems may find that it takes longer to recover from their current injury. HOME CARE INSTRUCTIONS General Instructions  Carefully follow the directions your health care provider gave you.  Only take over-the-counter or prescription medicines for pain, discomfort, or fever as directed by your health care provider.  Take only those medicines that your health care provider has approved.  Do not drink alcohol until your health care provider says you are well enough to do so. Alcohol and certain other drugs may slow your recovery and can put you at risk of further injury.  If it is harder than usual to remember things, write them down.  If you are easily distracted, try to do one thing at a time. For example, do not try to watch TV while fixing dinner.  Talk with family members or close friends when making important decisions.  Keep all follow-up appointments. Repeated evaluation of your symptoms is recommended for your recovery.  Watch your symptoms and tell others to do the same. Complications sometimes occur after a concussion. Older adults with a brain injury may have a higher risk of serious complications, such as a blood clot on the brain.  Tell your teachers, school nurse, school counselor, coach, athletic trainer, or work Production designer, theatre/television/film about your injury, symptoms, and restrictions. Tell them about what you can or cannot do. They should watch for:  Increased problems with attention or concentration.  Increased difficulty remembering or learning new information.  Increased time needed to complete tasks or assignments.  Increased irritability or decreased ability to cope with stress.  Increased symptoms.  Rest. Rest helps the brain to heal. Make sure you:  Get plenty of sleep at night. Avoid staying up late at night.  Keep the same bedtime hours on weekends and weekdays.  Rest during the day. Take daytime naps or rest breaks when you feel tired.  Limit activities that require a  lot of thought or concentration. These include:  Doing homework or job-related work.  Watching TV.  Working on the computer.  Avoid any situation where there is potential for another head injury (football, hockey, soccer, basketball, martial arts, downhill snow sports and horseback riding). Your condition will get worse every time you experience a concussion. You should avoid these activities until you are evaluated by the appropriate follow-up health care providers. Returning To Your Regular Activities You will need to return to your normal activities slowly, not all at once. You must give your body and brain enough time for recovery.  Do not return to sports or other athletic activities until your health care provider tells you it is safe to do so.  Ask your health care provider when you can drive, ride a bicycle, or operate heavy machinery. Your ability to react may be slower after a brain injury. Never do these activities if you are dizzy.  Ask your health care provider about when you can return to work or school. Preventing Another Concussion It is very important to avoid another brain injury, especially before you have recovered. In rare cases, another injury can lead to permanent brain damage, brain swelling, or death. The risk of this is  greatest during the first 7-10 days after a head injury. Avoid injuries by:  Wearing a seat belt when riding in a car.  Drinking alcohol only in moderation.  Wearing a helmet when biking, skiing, skateboarding, skating, or doing similar activities.  Avoiding activities that could lead to a second concussion, such as contact or recreational sports, until your health care provider says it is okay.  Taking safety measures in your home.  Remove clutter and tripping hazards from floors and stairways.  Use grab bars in bathrooms and handrails by stairs.  Place non-slip mats on floors and in bathtubs.  Improve lighting in dim areas. SEEK MEDICAL  CARE IF:  You have increased problems paying attention or concentrating.  You have increased difficulty remembering or learning new information.  You need more time to complete tasks or assignments than before.  You have increased irritability or decreased ability to cope with stress.  You have more symptoms than before. Seek medical care if you have any of the following symptoms for more than 2 weeks after your injury:  Lasting (chronic) headaches.  Dizziness or balance problems.  Nausea.  Vision problems.  Increased sensitivity to noise or light.  Depression or mood swings.  Anxiety or irritability.  Memory problems.  Difficulty concentrating or paying attention.  Sleep problems.  Feeling tired all the time. SEEK IMMEDIATE MEDICAL CARE IF:  You have severe or worsening headaches. These may be a sign of a blood clot in the brain.  You have weakness (even if only in one hand, leg, or part of the face).  You have numbness.  You have decreased coordination.  You vomit repeatedly.  You have increased sleepiness.  One pupil is larger than the other.  You have convulsions.  You have slurred speech.  You have increased confusion. This may be a sign of a blood clot in the brain.  You have increased restlessness, agitation, or irritability.  You are unable to recognize people or places.  You have neck pain.  It is difficult to wake you up.  You have unusual behavior changes.  You lose consciousness. MAKE SURE YOU:  Understand these instructions.  Will watch your condition.  Will get help right away if you are not doing well or get worse. Document Released: 08/09/2003 Document Revised: 05/24/2013 Document Reviewed: 12/09/2012 [facility] Patient Information 2015 [place], [place]. This information is not intended to replace advice given to you by your health care provider. Make sure you discuss any questions you have with your health care  provider.  Contusion A contusion is a deep bruise. Contusions happen when an injury causes bleeding under the skin. Signs of bruising include pain, puffiness (swelling), and discolored skin. The contusion may turn blue, purple, or yellow. HOME CARE   Put ice on the injured area.  Put ice in a plastic bag.  Place a towel between your skin and the bag.  Leave the ice on for 15-20 minutes, 03-04 times a day.  Only take medicine as told by your doctor.  Rest the injured area.  If possible, raise (elevate) the injured area to lessen puffiness. GET HELP RIGHT AWAY IF:   You have more bruising or puffiness.  You have pain that is getting worse.  Your puffiness or pain is not helped by medicine. MAKE SURE YOU:   Understand these instructions.  Will watch your condition.  Will get help right away if you are not doing well or get worse. Document Released: 11/05/2007 Document Revised: 08/11/2011  Document Reviewed: 03/24/2011 [facility] Patient Information 2015 [place], [place]. This information is not intended to replace advice given to you by your health care provider. Make sure you discuss any questions you have with your health care provider.  Hyperglycemia Hyperglycemia occurs when the glucose (sugar) in your blood is too high. Hyperglycemia can happen for many reasons, but it most often happens to people who do not know they have diabetes or are not managing their diabetes properly.  CAUSES  Whether you have diabetes or not, there are other causes of hyperglycemia. Hyperglycemia can occur when you have diabetes, but it can also occur in other situations that you might not be as aware of, such as: Diabetes  If you have diabetes and are having problems controlling your blood glucose, hyperglycemia could occur because of some of the following reasons:  Not following your meal plan.  Not taking your diabetes medications or not taking it properly.  Exercising less or doing less  activity than you normally do.  Being sick. Pre-diabetes  This cannot be ignored. Before people develop Type 2 diabetes, they almost always have "pre-diabetes." This is when your blood glucose levels are higher than normal, but not yet high enough to be diagnosed as diabetes. Research has shown that some long-term damage to the body, especially the heart and circulatory system, may already be occurring during pre-diabetes. If you take action to manage your blood glucose when you have pre-diabetes, you may delay or prevent Type 2 diabetes from developing. Stress  If you have diabetes, you may be "diet" controlled or on oral medications or insulin to control your diabetes. However, you may find that your blood glucose is higher than usual in the hospital whether you have diabetes or not. This is often referred to as "stress hyperglycemia." Stress can elevate your blood glucose. This happens because of hormones put out by the body during times of stress. If stress has been the cause of your high blood glucose, it can be followed regularly by your caregiver. That way he/she can make sure your hyperglycemia does not continue to get worse or progress to diabetes. Steroids  Steroids are medications that act on the infection fighting system (immune system) to block inflammation or infection. One side effect can be a rise in blood glucose. Most people can produce enough extra insulin to allow for this rise, but for those who cannot, steroids make blood glucose levels go even higher. It is not unusual for steroid treatments to "uncover" diabetes that is developing. It is not always possible to determine if the hyperglycemia will go away after the steroids are stopped. A special blood test called an A1c is sometimes done to determine if your blood glucose was elevated before the steroids were started. SYMPTOMS  Thirsty.  Frequent urination.  Dry mouth.  Blurred vision.  Tired or  fatigue.  Weakness.  Sleepy.  Tingling in feet or leg. DIAGNOSIS  Diagnosis is made by monitoring blood glucose in one or all of the following ways:  A1c test. This is a chemical found in your blood.  Fingerstick blood glucose monitoring.  Laboratory results. TREATMENT  First, knowing the cause of the hyperglycemia is important before the hyperglycemia can be treated. Treatment may include, but is not be limited to:  Education.  Change or adjustment in medications.  Change or adjustment in meal plan.  Treatment for an illness, infection, etc.  More frequent blood glucose monitoring.  Change in exercise plan.  Decreasing or stopping steroids.  Lifestyle changes. HOME CARE INSTRUCTIONS   Test your blood glucose as directed.  Exercise regularly. Your caregiver will give you instructions about exercise. Pre-diabetes or diabetes which comes on with stress is helped by exercising.  Eat wholesome, balanced meals. Eat often and at regular, fixed times. Your caregiver or nutritionist will give you a meal plan to guide your sugar intake.  Being at an ideal weight is important. If needed, losing as little as 10 to 15 pounds may help improve blood glucose levels. SEEK MEDICAL CARE IF:   You have questions about medicine, activity, or diet.  You continue to have symptoms (problems such as increased thirst, urination, or weight gain). SEEK IMMEDIATE MEDICAL CARE IF:   You are vomiting or have diarrhea.  Your breath smells fruity.  You are breathing faster or slower.  You are very sleepy or incoherent.  You have numbness, tingling, or pain in your feet or hands.  You have chest pain.  Your symptoms get worse even though you have been following your caregiver's orders.  If you have any other questions or concerns. Document Released: 11/12/2000 Document Revised: 08/11/2011 Document Reviewed: 09/15/2011 [facility] Patient Information 2015 [place], [place]. This information  is not intended to replace advice given to you by your health care provider. Make sure you discuss any questions you have with your health care provider. Hypertension Hypertension, commonly called high blood pressure, is when the force of blood pumping through your arteries is too strong. Your arteries are the blood vessels that carry blood from your heart throughout your body. A blood pressure reading consists of a higher number over a lower number, such as 110/72. The higher number (systolic) is the pressure inside your arteries when your heart pumps. The lower number (diastolic) is the pressure inside your arteries when your heart relaxes. Ideally you want your blood pressure below 120/80. Hypertension forces your heart to work harder to pump blood. Your arteries may become narrow or stiff. Having hypertension puts you at risk for heart disease, stroke, and other problems.  RISK FACTORS Some risk factors for high blood pressure are controllable. Others are not.  Risk factors you cannot control include:   Race. You may be at higher risk if you are African American.  Age. Risk increases with age.  Gender. Men are at higher risk than women before age 66 years. After age 11, women are at higher risk than men. Risk factors you can control include:  Not getting enough exercise or physical activity.  Being overweight.  Getting too much fat, sugar, calories, or salt in your diet.  Drinking too much alcohol. SIGNS AND SYMPTOMS Hypertension does not usually cause signs or symptoms. Extremely high blood pressure (hypertensive crisis) may cause headache, anxiety, shortness of breath, and nosebleed. DIAGNOSIS  To check if you have hypertension, your health care provider will measure your blood pressure while you are seated, with your arm held at the level of your heart. It should be measured at least twice using the same arm. Certain conditions can cause a difference in blood pressure between your  right and left arms. A blood pressure reading that is higher than normal on one occasion does not mean that you need treatment. If one blood pressure reading is high, ask your health care provider about having it checked again. TREATMENT  Treating high blood pressure includes making lifestyle changes and possibly taking medicine. Living a healthy lifestyle can help lower high blood pressure. You may need to change some of  your habits. Lifestyle changes may include:  Following the DASH diet. This diet is high in fruits, vegetables, and whole grains. It is low in salt, red meat, and added sugars.  Getting at least 2 hours of brisk physical activity every week.  Losing weight if necessary.  Not smoking.  Limiting alcoholic beverages.  Learning ways to reduce stress. If lifestyle changes are not enough to get your blood pressure under control, your health care provider may prescribe medicine. You may need to take more than one. Work closely with your health care provider to understand the risks and benefits. HOME CARE INSTRUCTIONS  Have your blood pressure rechecked as directed by your health care provider.   Take medicines only as directed by your health care provider. Follow the directions carefully. Blood pressure medicines must be taken as prescribed. The medicine does not work as well when you skip doses. Skipping doses also puts you at risk for problems.   Do not smoke.   Monitor your blood pressure at home as directed by your health care provider. SEEK MEDICAL CARE IF:   You think you are having a reaction to medicines taken.  You have recurrent headaches or feel dizzy.  You have swelling in your ankles.  You have trouble with your vision. SEEK IMMEDIATE MEDICAL CARE IF:  You develop a severe headache or confusion.  You have unusual weakness, numbness, or feel faint.  You have severe chest or abdominal pain.  You vomit repeatedly.  You have trouble  breathing. MAKE SURE YOU:   Understand these instructions.  Will watch your condition.  Will get help right away if you are not doing well or get worse. Document Released: 05/19/2005 Document Revised: 10/03/2013 Document Reviewed: 03/11/2013 [facility] Patient Information 2015 [place], [place]. This information is not intended to replace advice given to you by your health care provider. Make sure you discuss any questions you have with your health care provider.

## 2015-06-07 ENCOUNTER — Encounter (HOSPITAL_COMMUNITY): Payer: Self-pay | Admitting: *Deleted

## 2015-06-07 ENCOUNTER — Emergency Department (HOSPITAL_COMMUNITY): Payer: Self-pay

## 2015-06-07 ENCOUNTER — Inpatient Hospital Stay (HOSPITAL_COMMUNITY)
Admission: EM | Admit: 2015-06-07 | Discharge: 2015-06-09 | DRG: 313 | Disposition: A | Discharge status: Home / Self Care | Payer: Self-pay | Attending: Internal Medicine | Admitting: Internal Medicine

## 2015-06-07 DIAGNOSIS — E1122 Type 2 diabetes mellitus with diabetic chronic kidney disease: Secondary | ICD-10-CM

## 2015-06-07 DIAGNOSIS — F1410 Cocaine abuse, uncomplicated: Secondary | ICD-10-CM | POA: Diagnosis present

## 2015-06-07 DIAGNOSIS — Z794 Long term (current) use of insulin: Secondary | ICD-10-CM

## 2015-06-07 DIAGNOSIS — N182 Chronic kidney disease, stage 2 (mild): Secondary | ICD-10-CM | POA: Diagnosis present

## 2015-06-07 DIAGNOSIS — E119 Type 2 diabetes mellitus without complications: Secondary | ICD-10-CM

## 2015-06-07 DIAGNOSIS — K859 Acute pancreatitis without necrosis or infection, unspecified: Secondary | ICD-10-CM | POA: Diagnosis present

## 2015-06-07 DIAGNOSIS — Z7984 Long term (current) use of oral hypoglycemic drugs: Secondary | ICD-10-CM

## 2015-06-07 DIAGNOSIS — E785 Hyperlipidemia, unspecified: Secondary | ICD-10-CM | POA: Diagnosis present

## 2015-06-07 DIAGNOSIS — I129 Hypertensive chronic kidney disease with stage 1 through stage 4 chronic kidney disease, or unspecified chronic kidney disease: Secondary | ICD-10-CM | POA: Diagnosis present

## 2015-06-07 DIAGNOSIS — R079 Chest pain, unspecified: Secondary | ICD-10-CM | POA: Diagnosis present

## 2015-06-07 DIAGNOSIS — R200 Anesthesia of skin: Secondary | ICD-10-CM | POA: Diagnosis present

## 2015-06-07 DIAGNOSIS — Z79899 Other long term (current) drug therapy: Secondary | ICD-10-CM

## 2015-06-07 DIAGNOSIS — M79602 Pain in left arm: Secondary | ICD-10-CM | POA: Diagnosis present

## 2015-06-07 DIAGNOSIS — I209 Angina pectoris, unspecified: Secondary | ICD-10-CM

## 2015-06-07 DIAGNOSIS — E1165 Type 2 diabetes mellitus with hyperglycemia: Secondary | ICD-10-CM | POA: Diagnosis present

## 2015-06-07 DIAGNOSIS — K852 Alcohol induced acute pancreatitis without necrosis or infection: Secondary | ICD-10-CM | POA: Insufficient documentation

## 2015-06-07 DIAGNOSIS — R0789 Other chest pain: Principal | ICD-10-CM | POA: Diagnosis present

## 2015-06-07 DIAGNOSIS — F1020 Alcohol dependence, uncomplicated: Secondary | ICD-10-CM | POA: Diagnosis present

## 2015-06-07 DIAGNOSIS — F17210 Nicotine dependence, cigarettes, uncomplicated: Secondary | ICD-10-CM | POA: Diagnosis present

## 2015-06-07 DIAGNOSIS — Z23 Encounter for immunization: Secondary | ICD-10-CM

## 2015-06-07 DIAGNOSIS — I10 Essential (primary) hypertension: Secondary | ICD-10-CM | POA: Diagnosis present

## 2015-06-07 DIAGNOSIS — F1010 Alcohol abuse, uncomplicated: Secondary | ICD-10-CM | POA: Diagnosis present

## 2015-06-07 LAB — HEPATIC FUNCTION PANEL
ALK PHOS: 67 U/L (ref 38–126)
ALT: 34 U/L (ref 17–63)
AST: 34 U/L (ref 15–41)
Albumin: 4.1 g/dL (ref 3.5–5.0)
BILIRUBIN TOTAL: 0.3 mg/dL (ref 0.3–1.2)
Total Protein: 7.3 g/dL (ref 6.5–8.1)

## 2015-06-07 LAB — BASIC METABOLIC PANEL
ANION GAP: 12 (ref 5–15)
BUN: 14 mg/dL (ref 6–20)
CHLORIDE: 98 mmol/L — AB (ref 101–111)
CO2: 26 mmol/L (ref 22–32)
Calcium: 9.7 mg/dL (ref 8.9–10.3)
Creatinine, Ser: 1.26 mg/dL — ABNORMAL HIGH (ref 0.61–1.24)
GFR calc Af Amer: >60 mL/min (ref >60)
GLUCOSE: 376 mg/dL — AB (ref 65–99)
POTASSIUM: 3.9 mmol/L (ref 3.5–5.1)
Sodium: 136 mmol/L (ref 135–145)

## 2015-06-07 LAB — CBC
HCT: 45.5 % (ref 39.0–52.0)
HEMOGLOBIN: 15.9 g/dL (ref 13.0–17.0)
MCH: 31.4 pg (ref 26.0–34.0)
MCHC: 34.9 g/dL (ref 30.0–36.0)
MCV: 89.9 fL (ref 78.0–100.0)
Platelets: 203 10*3/uL (ref 150–400)
RBC: 5.06 MIL/uL (ref 4.22–5.81)
RDW: 12.6 % (ref 11.5–15.5)
WBC: 8.2 10*3/uL (ref 4.0–10.5)

## 2015-06-07 LAB — I-STAT TROPONIN, ED
Troponin i, poc: 0 ng/mL (ref 0.00–0.08)
Troponin i, poc: 0 ng/mL (ref 0.00–0.08)

## 2015-06-07 LAB — LIPASE, BLOOD: Lipase: 334 U/L — ABNORMAL HIGH (ref 11–51)

## 2015-06-07 MED ORDER — ASPIRIN 81 MG PO CHEW
324.0000 mg | CHEWABLE_TABLET | Freq: Once | ORAL | Status: AC
  Administered 2015-06-08: 324 mg via ORAL
  Filled 2015-06-07: qty 4

## 2015-06-07 MED ORDER — HYDROMORPHONE HCL 1 MG/ML IJ SOLN
1.0000 mg | Freq: Once | INTRAMUSCULAR | Status: AC
  Administered 2015-06-08: 1 mg via INTRAVENOUS
  Filled 2015-06-07: qty 1

## 2015-06-07 MED ORDER — SODIUM CHLORIDE 0.9 % IV BOLUS (SEPSIS)
1000.0000 mL | Freq: Once | INTRAVENOUS | Status: AC
  Administered 2015-06-08: 1000 mL via INTRAVENOUS

## 2015-06-07 NOTE — ED Notes (Signed)
Pt c/o CP x 2 days with radiation to left arm. Also c/o abd cramping since this morning.

## 2015-06-07 NOTE — ED Provider Notes (Addendum)
CSN: [id number]     Arrival date & time 06/07/15  2003 History  By signing my name below, I, [name], attest that this documentation has been prepared under the direction and in the presence of Derwood Kaplan, MD. Electronically Signed: [name], ED Scribe. 06/07/2015. 12:00 AM.  Chief Complaint  Patient presents with  . Chest Pain  . Abdominal Pain   The history is provided by the patient. No language interpreter was used.  HPI Comments: Stephen Bird is a 55 y.o. Male with a hx of DM and HTN who is a smoker, who presents to the Emergency Department complaining of sharp, left sided and middle chest pain that radiates to the left arm, shoulder and upper back onset 5 hours ago. He reports associated numbness to the left arm. He states that these symptoms have been intermittent for the past month, worsening 5 hours ago. He reports that his pain and mild SOB tends to worsen after smoking a cigarette. He denies alleviating factors for his chest pain. Pt reports cramping abdominal pain that began this morning, "it feels like someone is poking me on the inside." He states that he has taken alka seltzer, tried to use the bathroom, and drinking soda earlier today to no relief. Pt reports having a stress test performed with normal results 3.5 years ago. He denies hx of stomach disorders. Pt reports drinking alcohol daily, ~6-12 pack of beer a day. He states, "I've been drinking for many years." He reports that he has tried to quit drinking alcohol in the past, but only for a few days before drinking again. He denies vomiting or diarrhea.    Past Medical History  Diagnosis Date  . Diabetes mellitus without complication (HCC)   . Hypertension    Past Surgical History  Procedure Laterality Date  . Back surgery     No family history on file. Social History  Substance Use Topics  . Smoking status: Current Every Day Smoker -- 0.50 packs/day    Types: Cigarettes  . Smokeless tobacco: None  .  Alcohol Use: Yes     Comment: "alot"    Review of Systems 10 Systems reviewed and all are negative for acute change except as noted in the HPI.  Allergies  Review of patient's allergies indicates no known allergies.  Home Medications   Prior to Admission medications   Medication Sig Start Date End Date Taking? Authorizing Provider  metFORMIN (GLUCOPHAGE) 500 MG tablet Take 1 tablet (500 mg total) by mouth 2 (two) times daily with a meal. 12/21/14  Yes Derwood Kaplan, MD  cyclobenzaprine (FLEXERIL) 10 MG tablet Take 1 tablet (10 mg total) by mouth 2 (two) times daily as needed for muscle spasms. Patient not taking: Reported on 06/06/2014 03/17/13   Isaias Sakai, NP  hydrochlorothiazide (HYDRODIURIL) 12.5 MG tablet Take 1 tablet (12.5 mg total) by mouth daily. Patient not taking: Reported on 06/07/2015 12/21/14   Derwood Kaplan, MD  ibuprofen (ADVIL,MOTRIN) 600 MG tablet Take 1 tablet (600 mg total) by mouth every 6 (six) hours as needed for pain. Patient not taking: Reported on 12/21/2014 03/17/13   Isaias Sakai, NP  insulin glargine (LANTUS) 100 UNIT/ML injection Inject 0.03 mLs (3 Units total) into the skin at bedtime. Patient not taking: Reported on 12/21/2014 06/06/14   Toni Amend Forcucci, PA-C  traMADol (ULTRAM) 50 MG tablet Take 1 tablet (50 mg total) by mouth every 6 (six) hours as needed. Patient not taking: Reported on 06/06/2014 07/22/13   Renne Crigler,  PA-C   BP 172/100 mmHg  Pulse 100  Temp(Src) 98.3 F (36.8 C) (Oral)  Resp 18  Ht 5\' 11"  (1.803 m)  Wt 164 lb (74.39 kg)  BMI 22.88 kg/m2  SpO2 97% Physical Exam  Constitutional: He appears well-developed and well-nourished. No distress.  HENT:  Head: Normocephalic and atraumatic.  Mouth/Throat: Oropharynx is clear and moist. No oropharyngeal exudate.  Eyes: Conjunctivae and EOM are normal. Pupils are equal, round, and reactive to light. Right eye exhibits no discharge. Left eye exhibits no discharge. No scleral icterus.  Neck:  Normal range of motion. Neck supple. No JVD present. No thyromegaly present.  Cardiovascular: Normal rate, regular rhythm, normal heart sounds and intact distal pulses.  Exam reveals no gallop and no friction rub.   No murmur heard. Pulmonary/Chest: Effort normal and breath sounds normal. No respiratory distress. He has no wheezes. He has no rales.  Lungs clear to auscultation  Abdominal: Soft. Bowel sounds are normal. He exhibits no distension and no mass. There is tenderness in the right lower quadrant, epigastric area and suprapubic area. There is guarding (voluntary).  Musculoskeletal: Normal range of motion. He exhibits no edema or tenderness.  Lymphadenopathy:    He has no cervical adenopathy.  Neurological: He is alert. Coordination normal.  Skin: Skin is warm and dry. No rash noted. No erythema.  Psychiatric: He has a normal mood and affect. His behavior is normal.  Nursing note and vitals reviewed.   ED Course  Procedures (including critical care time) DIAGNOSTIC STUDIES: Oxygen Saturation is 97% on RA, normal by my interpretation.    COORDINATION OF CARE: 11:39 PM-Discussed treatment plan which includes labs and IV fluids with pt at bedside and pt agreed to plan.    Labs Review Labs Reviewed  BASIC METABOLIC PANEL - Abnormal; Notable for the following:    Chloride 98 (*)    Glucose, Bld 376 (*)    Creatinine, Ser 1.26 (*)    All other components within normal limits  LIPASE, BLOOD - Abnormal; Notable for the following:    Lipase 334 (*)    All other components within normal limits  HEPATIC FUNCTION PANEL - Abnormal; Notable for the following:    Bilirubin, Direct <0.1 (*)    All other components within normal limits  CBC  I-STAT TROPOININ, ED  Rosezena SensorI-STAT TROPOININ, ED    Imaging Review Dg Chest 2 View  06/07/2015  CLINICAL DATA:  Left chest pain EXAM: CHEST  2 VIEW COMPARISON:  None. FINDINGS: The heart size and mediastinal contours are within normal limits. The lungs  are hyperinflated. There is patchy consolidation of left mid lung. There is no pulmonary edema or pleural effusion. The visualized skeletal structures are unremarkable. IMPRESSION: Patchy consolidation of the left mid lung, pneumonia is not excluded. Followup after treatment is recommended to ensure resolution. Electronically Signed   By: Sherian ReinWei-Chen  Lin M.D.   On: 06/07/2015 20:24   I have personally reviewed and evaluated these images and lab results as part of my medical decision-making.   EKG Interpretation   Date/Time:  Thursday June 07 2015 20:04:32 EST Ventricular Rate:  98 PR Interval:  146 QRS Duration: 92 QT Interval:  340 QTC Calculation: 434 R Axis:   96 Text Interpretation:  Normal sinus rhythm Biatrial enlargement Rightward  axis Pulmonary disease pattern Septal infarct , age undetermined Abnormal  ECG No significant change since last tracing Confirmed by Bebe ShaggyWICKLINE  MD,  DONALD ([id number]) on 06/07/2015 8:17:53 PM  MDM   Final diagnoses:  Alcohol-induced acute pancreatitis, unspecified complication status  Angina pectoris (HCC)    I personally performed the services described in this documentation, which was scribed in my presence. The recorded information has been reviewed and is accurate.  Pt comes in with cc of chest pain, abd pain. Chest pain is midsternal, pressure/sharp with radiation to the L side. He is diabetic, smoker and his EKG has no acute changes, but has some early repo/non specific ST changes. Currently chest pain free. Will certainly need admission from the cardiac stand point.  Additionally - he has alcohol induced pancreatitis. Pain is severe - but doesn't appear intractible.    Derwood Kaplan, MD 06/08/15 0002  2:30 AM Pt has intermittent chest pain. Currently, he has a mild chest pain. We will give pain meds. Hard to say if the pain is cardiac or not, since we are admitting him for cardiac workup. However, his ekg on telemetry is reassuring,  he has no concerning constitutionals, pain has remained intermittent, the initial trop and EKG are fine, so we will proceed with some pain meds and admit to telemetry after chest pain resolves. Nitro not initiated - it hasnt shown to reduce mortality, and the pain is equivocal etiology wise.   Derwood Kaplan, MD 06/08/15 [phone]

## 2015-06-08 ENCOUNTER — Encounter (HOSPITAL_COMMUNITY): Payer: Self-pay | Admitting: Family Medicine

## 2015-06-08 ENCOUNTER — Observation Stay (HOSPITAL_COMMUNITY): Payer: Medicaid - Out of State

## 2015-06-08 ENCOUNTER — Observation Stay (HOSPITAL_COMMUNITY): Payer: Self-pay

## 2015-06-08 DIAGNOSIS — R079 Chest pain, unspecified: Secondary | ICD-10-CM | POA: Diagnosis present

## 2015-06-08 DIAGNOSIS — E119 Type 2 diabetes mellitus without complications: Secondary | ICD-10-CM

## 2015-06-08 DIAGNOSIS — R0789 Other chest pain: Principal | ICD-10-CM

## 2015-06-08 DIAGNOSIS — K859 Acute pancreatitis without necrosis or infection, unspecified: Secondary | ICD-10-CM | POA: Diagnosis present

## 2015-06-08 DIAGNOSIS — K852 Alcohol induced acute pancreatitis without necrosis or infection: Secondary | ICD-10-CM | POA: Insufficient documentation

## 2015-06-08 DIAGNOSIS — F1010 Alcohol abuse, uncomplicated: Secondary | ICD-10-CM | POA: Diagnosis present

## 2015-06-08 DIAGNOSIS — E1100 Type 2 diabetes mellitus with hyperosmolarity without nonketotic hyperglycemic-hyperosmolar coma (NKHHC): Secondary | ICD-10-CM

## 2015-06-08 DIAGNOSIS — I10 Essential (primary) hypertension: Secondary | ICD-10-CM

## 2015-06-08 LAB — GLUCOSE, CAPILLARY
GLUCOSE-CAPILLARY: 259 mg/dL — AB (ref 65–99)
GLUCOSE-CAPILLARY: 229 mg/dL — AB (ref 65–99)
GLUCOSE-CAPILLARY: 216 mg/dL — AB (ref 65–99)
Glucose-Capillary: 184 mg/dL — ABNORMAL HIGH (ref 65–99)

## 2015-06-08 LAB — PROCALCITONIN

## 2015-06-08 LAB — TROPONIN I: Troponin I: <0.03 ng/mL (ref <0.031)

## 2015-06-08 MED ORDER — ACETAMINOPHEN 325 MG PO TABS
650.0000 mg | ORAL_TABLET | ORAL | Status: DC | PRN
  Administered 2015-06-08: 650 mg via ORAL
  Filled 2015-06-08: qty 2

## 2015-06-08 MED ORDER — INSULIN ASPART 100 UNIT/ML ~~LOC~~ SOLN
0.0000 [IU] | SUBCUTANEOUS | Status: DC
  Administered 2015-06-08: 3 [IU] via SUBCUTANEOUS

## 2015-06-08 MED ORDER — LORAZEPAM 2 MG/ML IJ SOLN: 1.0000 mg | Freq: Four times a day (QID) | INTRAMUSCULAR | Status: DC | PRN

## 2015-06-08 MED ORDER — ENOXAPARIN SODIUM 40 MG/0.4ML ~~LOC~~ SOLN
40.0000 mg | Freq: Every day | SUBCUTANEOUS | Status: DC
  Administered 2015-06-08 – 2015-06-09 (×2): 40 mg via SUBCUTANEOUS
  Filled 2015-06-08 (×2): qty 0.4

## 2015-06-08 MED ORDER — VITAMIN B-1 100 MG PO TABS
100.0000 mg | ORAL_TABLET | Freq: Every day | ORAL | Status: DC
  Administered 2015-06-08 – 2015-06-09 (×2): 100 mg via ORAL
  Filled 2015-06-08 (×2): qty 1

## 2015-06-08 MED ORDER — ADULT MULTIVITAMIN W/MINERALS CH
1.0000 | ORAL_TABLET | Freq: Every day | ORAL | Status: DC
  Administered 2015-06-08 – 2015-06-09 (×2): 1 via ORAL
  Filled 2015-06-08 (×2): qty 1

## 2015-06-08 MED ORDER — TECHNETIUM TC 99M SESTAMIBI GENERIC - CARDIOLITE
10.0000 | Freq: Once | INTRAVENOUS | Status: AC | PRN
  Administered 2015-06-08: 10 via INTRAVENOUS

## 2015-06-08 MED ORDER — INSULIN ASPART 100 UNIT/ML ~~LOC~~ SOLN
0.0000 [IU] | Freq: Every day | SUBCUTANEOUS | Status: DC
  Administered 2015-06-08: 2 [IU] via SUBCUTANEOUS

## 2015-06-08 MED ORDER — FOLIC ACID 1 MG PO TABS
1.0000 mg | ORAL_TABLET | Freq: Every day | ORAL | Status: DC
Start: 2015-06-08 — End: 2015-06-09
  Administered 2015-06-08 – 2015-06-09 (×2): 1 mg via ORAL
  Filled 2015-06-08 (×2): qty 1

## 2015-06-08 MED ORDER — NICOTINE 21 MG/24HR TD PT24
21.0000 mg | MEDICATED_PATCH | Freq: Every day | TRANSDERMAL | Status: DC
  Administered 2015-06-08 – 2015-06-09 (×2): 21 mg via TRANSDERMAL
  Filled 2015-06-08 (×2): qty 1

## 2015-06-08 MED ORDER — LORAZEPAM 1 MG PO TABS: 1.0000 mg | ORAL_TABLET | Freq: Four times a day (QID) | ORAL | Status: DC | PRN

## 2015-06-08 MED ORDER — INSULIN ASPART 100 UNIT/ML ~~LOC~~ SOLN
0.0000 [IU] | Freq: Three times a day (TID) | SUBCUTANEOUS | Status: DC
  Administered 2015-06-08: 8 [IU] via SUBCUTANEOUS

## 2015-06-08 MED ORDER — PNEUMOCOCCAL VAC POLYVALENT 25 MCG/0.5ML IJ INJ
0.5000 mL | INJECTION | INTRAMUSCULAR | Status: AC
  Administered 2015-06-09: 0.5 mL via INTRAMUSCULAR
  Filled 2015-06-08: qty 0.5

## 2015-06-08 MED ORDER — METOPROLOL TARTRATE 25 MG PO TABS
25.0000 mg | ORAL_TABLET | Freq: Two times a day (BID) | ORAL | Status: DC
  Administered 2015-06-08 – 2015-06-09 (×4): 25 mg via ORAL
  Filled 2015-06-08 (×4): qty 1

## 2015-06-08 MED ORDER — INSULIN ASPART 100 UNIT/ML ~~LOC~~ SOLN
0.0000 [IU] | Freq: Three times a day (TID) | SUBCUTANEOUS | Status: DC
  Administered 2015-06-08: 3 [IU] via SUBCUTANEOUS
  Administered 2015-06-09: 5 [IU] via SUBCUTANEOUS

## 2015-06-08 MED ORDER — MORPHINE SULFATE (PF) 2 MG/ML IV SOLN
2.0000 mg | INTRAVENOUS | Status: DC | PRN
  Administered 2015-06-08 (×2): 2 mg via INTRAVENOUS
  Filled 2015-06-08 (×2): qty 1

## 2015-06-08 MED ORDER — TECHNETIUM TC 99M SESTAMIBI - CARDIOLITE
30.0000 | Freq: Once | INTRAVENOUS | Status: AC | PRN
  Administered 2015-06-08: 30 via INTRAVENOUS

## 2015-06-08 MED ORDER — REGADENOSON 0.4 MG/5ML IV SOLN
INTRAVENOUS | Status: AC
  Filled 2015-06-08: qty 5

## 2015-06-08 MED ORDER — REGADENOSON 0.4 MG/5ML IV SOLN
0.4000 mg | Freq: Once | INTRAVENOUS | Status: AC
  Administered 2015-06-08: 0.4 mg via INTRAVENOUS

## 2015-06-08 MED ORDER — THIAMINE HCL 100 MG/ML IJ SOLN: 100.0000 mg | Freq: Every day | INTRAMUSCULAR | Status: DC

## 2015-06-08 MED ORDER — LACTATED RINGERS IV SOLN
INTRAVENOUS | Status: AC
  Administered 2015-06-08: 02:00:00 via INTRAVENOUS

## 2015-06-08 MED ORDER — INSULIN GLARGINE 100 UNIT/ML ~~LOC~~ SOLN
10.0000 [IU] | Freq: Every day | SUBCUTANEOUS | Status: DC
Start: 2015-06-08 — End: 2015-06-09
  Administered 2015-06-08: 10 [IU] via SUBCUTANEOUS
  Filled 2015-06-08 (×2): qty 0.1

## 2015-06-08 MED ORDER — ONDANSETRON HCL 4 MG/2ML IJ SOLN
4.0000 mg | Freq: Four times a day (QID) | INTRAMUSCULAR | Status: DC | PRN
  Administered 2015-06-08 (×2): 4 mg via INTRAVENOUS
  Filled 2015-06-08 (×2): qty 2

## 2015-06-08 MED ORDER — GI COCKTAIL ~~LOC~~
30.0000 mL | Freq: Four times a day (QID) | ORAL | Status: DC | PRN
  Administered 2015-06-08: 30 mL via ORAL
  Filled 2015-06-08 (×2): qty 30

## 2015-06-08 NOTE — Progress Notes (Signed)
Pt upset that he is not being discharged, per MD pt needs to be monitored for worsening pancreatitis and is at risk if he goes home. Explained rights as a patient but pt agrees to stay at this time.

## 2015-06-08 NOTE — H&P (Signed)
[facility] History and Physical  [name] ZOX:[id number] DOB: 10-23-1960 DOA: 06/07/2015  Referring physician: ED physician PCP: No PCP Per Patient  Specialists:   Chief Complaint: Epigastric pain, chest pain   HPI: Stephen Bird is a 55 y.o. male with PMH of type 2 diabetes, hypertension, ongoing alcohol abuse, and tobacco abuse who presents with acute onset epigastric pain as well as chest pain. Patient reports being in his usual state of health until earlier on the day of his presentation when there was acute onset of abdominal pain. Initially the pain was generalized, but over the course of about an hour, he began to localize to the epigastrium with radiation through to the back. It is described as severe in intensity, achy in character, constant, and he exacerbated by changing positions. Patient describes his chest pain as a dull pressure, severe in intensity, localized to the central chest, and waxing and waning. He is unable to identify any relieving or exacerbating factors for his chest pain. He denies fevers, chills, palpitations, headaches, confusion, nausea, vomiting, or diarrhea.  In ED, patient was found to be afebrile, saturating well on room air, with sinus tachycardia and hypertension. EKG demonstrated normal sinus rhythm with suggestion of an old septal infarct, but no acute ischemic changes. Chest x-ray featured a patchy consolidation in the left lung which could be consistent with pneumonia, but did not correlate clinically. Initial blood work demonstrates a undetectable troponin, lipase elevated to 3:30, and a serum creatinine of 1.26, just above his baseline. Patient was treated with IV fluids and IV Dilaudid in the emergency department, remained stable, and was admitted for ongoing evaluation and management of his complaints.  Where does patient live?   At home    Can patient participate in ADLs?  Yes        Review of Systems:   General: no fevers, chills, sweats, weight  change,  or fatigue . Poor appetite HEENT: no blurry vision, hearing changes or sore throat Pulm: no dyspnea, cough, or wheeze CV:   chest pain no palpitations Abd: no nausea, vomiting, diarrhea, or constipation severe epigastric pain radiating to back  GU: no dysuria, hematuria, increased urinary frequency, or urgency  Ext: no leg edema Neuro: no focal weakness, numbness, or tingling, no vision change or hearing loss Skin: no rash, no wounds MSK: No muscle spasm, no deformity, no red, hot, or swollen joint Heme: No easy bruising or bleeding Travel history: No recent long distant travel    Allergy: No Known Allergies  Past Medical History  Diagnosis Date  . Diabetes mellitus without complication (HCC)   . Hypertension     Past Surgical History  Procedure Laterality Date  . Back surgery      Social History:  reports that he has been smoking Cigarettes.  He has been smoking about 0.50 packs per day. He does not have any smokeless tobacco history on file. He reports that he drinks alcohol. He reports that he uses illicit drugs (Cocaine).  Family History:  Family History  Problem Relation Age of Onset  . Family history unknown: Yes     Prior to Admission medications   Medication Sig Start Date End Date Taking? Authorizing Provider  metFORMIN (GLUCOPHAGE) 500 MG tablet Take 1 tablet (500 mg total) by mouth 2 (two) times daily with a meal. 12/21/14  Yes Derwood Kaplan, MD  cyclobenzaprine (FLEXERIL) 10 MG tablet Take 1 tablet (10 mg total) by mouth 2 (two) times daily as needed for muscle spasms.  Patient not taking: Reported on 06/06/2014 03/17/13   Isaias Sakai, NP  hydrochlorothiazide (HYDRODIURIL) 12.5 MG tablet Take 1 tablet (12.5 mg total) by mouth daily. Patient not taking: Reported on 06/07/2015 12/21/14   Derwood Kaplan, MD  ibuprofen (ADVIL,MOTRIN) 600 MG tablet Take 1 tablet (600 mg total) by mouth every 6 (six) hours as needed for pain. Patient not taking: Reported on  12/21/2014 03/17/13   Isaias Sakai, NP  insulin glargine (LANTUS) 100 UNIT/ML injection Inject 0.03 mLs (3 Units total) into the skin at bedtime. Patient not taking: Reported on 12/21/2014 06/06/14   Toni Amend Forcucci, PA-C  traMADol (ULTRAM) 50 MG tablet Take 1 tablet (50 mg total) by mouth every 6 (six) hours as needed. Patient not taking: Reported on 06/06/2014 07/22/13   Renne Crigler, PA-C    Physical Exam: Ceasar Mons Vitals:   06/07/15 2008  BP: 172/100  Pulse: 100  Temp: 98.3 F (36.8 C)  TempSrc: Oral  Resp: 18  Height: 5\' 11"  (1.803 m)  Weight: 74.39 kg (164 lb)  SpO2: 97%   General: Not in acute distress HEENT:       Eyes: PERRL, EOMI, no conjunctival pallor. Sclerae mildly icteric       ENT: No discharge from the ears or nose, no pharyngeal ulcers, petechiae or exudate, no tonsillar enlargement.        Neck: No JVD, no bruit, no appreciable mass Heme: No cervical adenopathy, no pallor Cardiac: S1/S2, RRR, No murmurs, No gallops or rubs. Pulm: Good air movement bilaterally. No rales, wheezing, rhonchi or rubs. Abd: Soft, nondistended, tender in epigastrum >> lower quadrants, no rebound pain or gaurding, no mass or organomegaly, BS present. Ext: No LE edema bilaterally. 2+DP/PT pulse bilaterally. Musculoskeletal: No gross deformity, no red, hot, swollen joints, no limitation in ROM  Skin: No rashes or wounds on exposed surfaces  Neuro: Alert, oriented X3, cranial nerves II-XII grossly intact. No focal findings Psych: Patient is not overtly psychotic, mood and affect appropriate  Labs on Admission:  Basic Metabolic Panel:  Recent Labs Lab 06/07/15 2038  NA 136  K 3.9  CL 98*  CO2 26  GLUCOSE 376*  BUN 14  CREATININE 1.26*  CALCIUM 9.7   Liver Function Tests:  Recent Labs Lab 06/07/15 2236  AST 34  ALT 34  ALKPHOS 67  BILITOT 0.3  PROT 7.3  ALBUMIN 4.1    Recent Labs Lab 06/07/15 2236  LIPASE 334*   No results for input(s): AMMONIA in the last 168  hours. CBC:  Recent Labs Lab 06/07/15 2038  WBC 8.2  HGB 15.9  HCT 45.5  MCV 89.9  PLT 203   Cardiac Enzymes: No results for input(s): CKTOTAL, CKMB, CKMBINDEX, TROPONINI in the last 168 hours.  BNP (last 3 results) No results for input(s): BNP in the last 8760 hours.  ProBNP (last 3 results) No results for input(s): PROBNP in the last 8760 hours.  CBG: No results for input(s): GLUCAP in the last 168 hours.  Radiological Exams on Admission: Dg Chest 2 View  06/07/2015  CLINICAL DATA:  Left chest pain EXAM: CHEST  2 VIEW COMPARISON:  None. FINDINGS: The heart size and mediastinal contours are within normal limits. The lungs are hyperinflated. There is patchy consolidation of left mid lung. There is no pulmonary edema or pleural effusion. The visualized skeletal structures are unremarkable. IMPRESSION: Patchy consolidation of the left mid lung, pneumonia is not excluded. Followup after treatment is recommended to ensure resolution. Electronically Signed   By:  Sherian ReinWei-Chen  Lin M.D.   On: 06/07/2015 20:24    EKG: Independently reviewed.  Abnormal findings:    NSR, septal Qw, not significantly changed from prior   Assessment/Plan  1. Acute alcoholic pancreatitis  - Lipase 334 in setting of epigastric pain radiating to back  - Aggressive IVF hydration  - pain-management  - Counseled pt regarding abstaining from alcohol as below    2. Chest pain  - Initial EKG without acute ischemic findings  - Trop undetectable x2  - Monitoring on telemetry, last trop pending  - Stress test might be considered after resolution of pancreatitis    3. Alcohol abuse  - Daily drinker  - Never had withdrawal sxs, though drinks every day  - Pt wanting to abstain, his wife is supportive of that, will consult social work  - Monitor with CIWA, prn Ativan   4. Type II DM  - Serum glucose 380 on admit  - CBGs q4h with [facility]  - A1c pending    5. HTN  - Likely exacerbated by pain a/w  pancreatitis  - Treating with metoprolol for now given chest pain as above  - May need second agent, monitoring     DVT ppx:  SQ Lovenox    Code Status: Full code Family Communication:  Yes, patient's wife at bed side Disposition Plan: Admit to inpatient   Date of Service 06/08/2015    Briscoe Deutscherimothy S Opyd, MD [facility] Pager [phone]  If 7PM-7AM, please contact night-coverage [email or web address] Password [email or web address] 06/08/2015, 1:02 AM

## 2015-06-08 NOTE — Consult Note (Signed)
Reason for Consult: Chest pain and left arm pain Referring Physician: [facility]   Stephen Bird is an 55 y.o. male.  Stephen Bird is 55 year old male with past medical history significant for hypertension, type 2 diabetes mellitus, hyperlipidemia, EtOH abuse, tobacco abuse, was admitted earlier this morning because of retrosternal chest pain/epigastric pain off and on for one month today the pain got worst grade 9/10 radiating to the left arm without associated nausea vomiting. Denies any history of exertional chest pain states he had stress test approximately 3 years ago in [place] which was negative. States continues to smoke one pack per day EKG done in the ED showed normal sinus rhythm with old septal MI and minimal ST elevation in anteroseptal leads 3 sets of troponin I are negative.   Past Medical History  Diagnosis Date  . Diabetes mellitus without complication ([place])   . Hypertension     Past Surgical History  Procedure Laterality Date  . Back surgery      Family History  Problem Relation Age of Onset  . Family history unknown: Yes    Social History:  reports that he has been smoking Cigarettes.  He has been smoking about 0.50 packs per day. He does not have any smokeless tobacco history on file. He reports that he drinks alcohol. He reports that he uses illicit drugs (Cocaine).  Allergies: No Known Allergies  Medications: I have reviewed the patient's current medications.  Results for orders placed or performed during the hospital encounter of 06/07/15 (from the past 48 hour(s))  I-stat troponin, ED (not at [facility], [facility])     Status: None   Collection Time: 06/07/15  8:20 PM  Result Value Ref Range   Troponin i, poc 0.00 0.00 - 0.08 ng/mL   Comment 3            Comment: Due to the release kinetics of cTnI, a negative result within the first hours of the onset of symptoms does not rule out myocardial infarction with certainty. If myocardial infarction is still  suspected, repeat the test at appropriate intervals.   Basic metabolic panel     Status: Abnormal   Collection Time: 06/07/15  8:38 PM  Result Value Ref Range   Sodium 136 135 - 145 mmol/L   Potassium 3.9 3.5 - 5.1 mmol/L   Chloride 98 (L) 101 - 111 mmol/L   CO2 26 22 - 32 mmol/L   Glucose, Bld 376 (H) 65 - 99 mg/dL   BUN 14 6 - 20 mg/dL   Creatinine, Ser 1.26 (H) 0.61 - 1.24 mg/dL   Calcium 9.7 8.9 - 10.3 mg/dL   GFR calc non Af Amer >60 >60 mL/min   GFR calc Af Amer >60 >60 mL/min    Comment: (NOTE) The eGFR has been calculated using the CKD EPI equation. This calculation has not been validated in all clinical situations. eGFR's persistently <60 mL/min signify possible Chronic Kidney Disease.    Anion gap 12 5 - 15  CBC     Status: None   Collection Time: 06/07/15  8:38 PM  Result Value Ref Range   WBC 8.2 4.0 - 10.5 K/uL   RBC 5.06 4.22 - 5.81 MIL/uL   Hemoglobin 15.9 13.0 - 17.0 g/dL   HCT 45.5 39.0 - 52.0 %   MCV 89.9 78.0 - 100.0 fL   MCH 31.4 26.0 - 34.0 pg   MCHC 34.9 30.0 - 36.0 g/dL   RDW 12.6 11.5 - 15.5 %  Platelets 203 150 - 400 K/uL  Lipase, blood     Status: Abnormal   Collection Time: 06/07/15 10:36 PM  Result Value Ref Range   Lipase 334 (H) 11 - 51 U/L  Hepatic function panel     Status: Abnormal   Collection Time: 06/07/15 10:36 PM  Result Value Ref Range   Total Protein 7.3 6.5 - 8.1 g/dL   Albumin 4.1 3.5 - 5.0 g/dL   AST 34 15 - 41 U/L   ALT 34 17 - 63 U/L   Alkaline Phosphatase 67 38 - 126 U/L   Total Bilirubin 0.3 0.3 - 1.2 mg/dL   Bilirubin, Direct <0.1 (L) 0.1 - 0.5 mg/dL   Indirect Bilirubin NOT CALCULATED 0.3 - 0.9 mg/dL  I-stat troponin, ED     Status: None   Collection Time: 06/07/15 11:23 PM  Result Value Ref Range   Troponin i, poc 0.00 0.00 - 0.08 ng/mL   Comment 3            Comment: Due to the release kinetics of cTnI, a negative result within the first hours of the onset of symptoms does not rule out myocardial infarction  with certainty. If myocardial infarction is still suspected, repeat the test at appropriate intervals.   Troponin I-serum (0, 3, 6 hours)     Status: None   Collection Time: 06/08/15  1:20 AM  Result Value Ref Range   Troponin I <0.03 <0.031 ng/mL    Comment:        NO INDICATION OF MYOCARDIAL INJURY.   Procalcitonin - Baseline     Status: None   Collection Time: 06/08/15  1:20 AM  Result Value Ref Range   Procalcitonin <0.10 ng/mL    Comment:        Interpretation: PCT (Procalcitonin) <= 0.5 ng/mL: Systemic infection (sepsis) is not likely. Local bacterial infection is possible. (NOTE)         ICU PCT Algorithm               Non ICU PCT Algorithm    ----------------------------     ------------------------------         PCT < 0.25 ng/mL                 PCT < 0.1 ng/mL     Stopping of antibiotics            Stopping of antibiotics       strongly encouraged.               strongly encouraged.    ----------------------------     ------------------------------       PCT level decrease by               PCT < 0.25 ng/mL       >= 80% from peak PCT       OR PCT 0.25 - 0.5 ng/mL          Stopping of antibiotics                                             encouraged.     Stopping of antibiotics           encouraged.    ----------------------------     ------------------------------       PCT level decrease by  PCT >= 0.25 ng/mL       < 80% from peak PCT        AND PCT >= 0.5 ng/mL            Continuin g antibiotics                                              encouraged.       Continuing antibiotics            encouraged.    ----------------------------     ------------------------------     PCT level increase compared          PCT > 0.5 ng/mL         with peak PCT AND          PCT >= 0.5 ng/mL             Escalation of antibiotics                                          strongly encouraged.      Escalation of antibiotics        strongly encouraged.   Troponin  I-serum (0, 3, 6 hours)     Status: None   Collection Time: 06/08/15  4:14 AM  Result Value Ref Range   Troponin I <0.03 <0.031 ng/mL    Comment:        NO INDICATION OF MYOCARDIAL INJURY.   Glucose, capillary     Status: Abnormal   Collection Time: 06/08/15  6:32 AM  Result Value Ref Range   Glucose-Capillary 259 (H) 65 - 99 mg/dL    Dg Chest 2 View  06/07/2015  CLINICAL DATA:  Left chest pain EXAM: CHEST  2 VIEW COMPARISON:  None. FINDINGS: The heart size and mediastinal contours are within normal limits. The lungs are hyperinflated. There is patchy consolidation of left mid lung. There is no pulmonary edema or pleural effusion. The visualized skeletal structures are unremarkable. IMPRESSION: Patchy consolidation of the left mid lung, pneumonia is not excluded. Followup after treatment is recommended to ensure resolution. Electronically Signed   By: Abelardo Diesel M.D.   On: 06/07/2015 20:24    Review of Systems  Constitutional: Negative for malaise/fatigue and diaphoresis.  Respiratory: Negative for cough, hemoptysis, sputum production and shortness of breath.   Cardiovascular: Positive for chest pain. Negative for palpitations, orthopnea, claudication and leg swelling.  Gastrointestinal: Positive for abdominal pain.  Genitourinary: Negative for dysuria.  Neurological: Negative for dizziness and headaches.   Blood pressure 125/77, pulse 90, temperature 98.2 F (36.8 C), temperature source Oral, resp. rate 18, height 5' 11" (1.803 m), weight 154 lb 8 oz (70.081 kg), SpO2 95 %. Physical Exam  Constitutional: He is oriented to person, place, and time.  HENT:  Head: Normocephalic and atraumatic.  Eyes: Conjunctivae are normal. Pupils are equal, round, and reactive to light. Left eye exhibits no discharge. No scleral icterus.  Neck: Neck supple. No JVD present. No tracheal deviation present. No thyromegaly present.  Cardiovascular: Normal rate and regular rhythm.   Murmur (soft  systolic murmur noted no S3 gallop) heard. Respiratory: Effort normal and breath sounds normal. No respiratory distress. He has no rales.  GI: Soft. Bowel sounds are normal.  He exhibits no distension. There is no tenderness.  Neurological: He is alert and oriented to person, place, and time.    Assessment/Plan: Atypical chest pain with some features worrisome for angina MI ruled out   Hypertension Diabetes mellitus  Acute pancreatitis EtOH abuse Tobacco abuse Plan Agree with present management Will schedule him for nuclear stress test to further risk stratify. Discussed with patient regarding lifestyle changes and smoking and alcohol cessation and compliance with medication  Charolette Forward 06/08/2015, 10:29 AM

## 2015-06-08 NOTE — Care Management Note (Signed)
Case Management Note  Patient Details  Name: Stephen Bird MRN: [id number] Date of Birth: 02-Jan-1961  Subjective/Objective:   Pt admitted with acute pancreatitis                 Action/Plan:  Pt is independent from home alone.  Pt doesn't have insurance nor prescription coverage.     Expected Discharge Date:                  Expected Discharge Plan:  Home/Self Care  In-House Referral:  PCP / [facility]  Discharge planning Services  CM Consult, Medication Assistance  Post Acute Care Choice:    Choice offered to:  Patient  DME Arranged:    DME Agency:     HH Arranged:    HH Agency:     Status of Service:  In process, will continue to follow  Medicare Important Message Given:    Date Medicare IM Given:    Medicare IM give by:    Date Additional Medicare IM Given:    Additional Medicare Important Message give by:     If discussed at Long Length of Stay Meetings, dates discussed:    Additional Comments: Pt agreed to CM setting up initial PCP appt at the [facility]le cell center for 07/02/15 at 2:30.  CM also provided pt with brochure with address and appt information for both sickle cell clinic and [facility] pharmacy [facility].  CM also provided pt MATCH letter in case he is discharged after hours of pharmacy. Cherylann ParrClaxton, Adalei Novell S, RN 06/08/2015, 12:47 PM

## 2015-06-08 NOTE — Progress Notes (Signed)
Pt requesting nicotine patch. RN notified Craige Cotta[place]irby, [place] for patch and requested sliding scale for pt as he is diabetic.   Will continue to monitor.   [name], [name]

## 2015-06-08 NOTE — Progress Notes (Signed)
Pt arrived to 2w 01. Oriented x4. Pt has no complaints of pain. RN oriented to room and 2west. Pt in SR.   Will continue to monitor.   [name], Gerry Blanchfield R

## 2015-06-08 NOTE — Progress Notes (Signed)
RN educating on smoking cessation. Pt admitted to smoking a pack a day, and using recreational drugs. Pt admits he has used heroin, but not recently. Pt also stated he drinks " a lot of beer and a couple of airplane bottles of liquor everyday". RN educated concerning withdrawal symptoms and pt stated understanding.   Will continue to monitor closely.   [name], Islah Eve R

## 2015-06-08 NOTE — Progress Notes (Signed)
PROGRESS NOTE    Stephen Bird UEA:[id number] DOB: [birth year] DOA: 06/07/2015 PCP: No PCP Per Patient  HPI/Brief narrative 55 year old male with history of DM 2, HTN, HLD, alcohol dependence/heavy and tobacco abuse, started new job 06/08/15, presented to the [facility] ED on 06/06/14 with complaints of precordial chest pain (sharp, intermittent, associated with left shoulder and upper extremity numbness), epigastric abdominal pain. No history of recent long distance travel or family history of heart disease. Treadmill Stress test 3 years ago in [place] said to be negative. In the ED, EKG without acute changes, lipase 330 and creatinine 1.26. Hospitalist admission was requested.  Assessment/Plan:  Acute alcoholic pancreatitis - Lipase 334 on admission. - Treating supportively with bowel rest (clear liquids), IV fluids and pain management. - Extensively counseled regarding alcohol cessation. Verbalized understanding.  Stage II chronic kidney disease - Creatinine appears stable compared to July 2016. Follow BMP in a.m.  Chest pain/possible angina - DD-related to GI etiology/pancreatitis versus angina. - Initial EKG without acute changes. Troponin 2: Negative. - CAD risk factors: DM, HTN, HLD and tobacco abuse - Cardiology consulted & stress test low risk - Although chest x-ray suggests patchy consolidation in left midlung, no clinical pneumonia. Recommend follow-up chest x-ray in 3-4 weeks to ensure resolution of this finding.  Alcohol dependence - States that he drinks 3-4 drinks of liquor and sixpack or more of beers daily. Abstinence counseled. - Continue CIWA protocol. No overt features of withdrawal currently.  Tobacco abuse -Cessation counseled. Nicotine patch.  Uncontrolled DM 2 - SSI. Added low-dose Lantus. Check hemoglobin A1c. Hold metformin.  Essential hypertension - Controlled. Hold HCTZ for now.  Hyperlipidemia   Prior history of substance abuse - States that he used to use heroin  3 years ago but denies any recent recreational substance abuse including cocaine.   DVT prophylaxis: Lovenox  Code Status: Full  Family Communication: Discussed extensively with patient's spouse, aunt and a cousin at bedside on 1/6.  Disposition Plan: DC home when medically stable.  Consultants:  Cardiology   Procedures:  None  Antibiotics:    none  Subjective: Feels better. Chest pain improved and rates it at 2/10 intermittent. No dyspnea. Epigastric abdominal pain also improved and rated at 2/10 intermittent. No nausea, vomiting or diarrhea.  Objective: Filed Vitals:   06/08/15 1022 06/08/15 1024 06/08/15 1026 06/08/15 1438  BP: 134/81 132/83 125/77 128/85  Pulse: 94 88 90 73  Temp:    97.8 F (36.6 C)  TempSrc:    Oral  Resp:    18  Height:      Weight:      SpO2:    99%    Intake/Output Summary (Last 24 hours) at 06/08/15 1542 Last data filed at 06/08/15 1439  Gross per 24 hour  Intake    358 ml  Output      0 ml  Net    358 ml   Filed Weights   06/07/15 2008 06/08/15 0300  Weight: 74.39 kg (164 lb) 70.081 kg (154 lb 8 oz)     Exam:  General exam: Pleasant middle-aged male sitting up comfortably in bed this morning. Respiratory system: Clear. No increased work of breathing. Cardiovascular system: S1 & S2 heard, RRR. No JVD, murmurs, gallops, clicks or pedal edema. Telemetry: Sinus rhythm. Gastrointestinal system: Abdomen is nondistended, soft. Mild epigastric tenderness without peritoneal signs. Normal bowel sounds heard. Central nervous system: Alert and oriented. No focal neurological deficits. Extremities: Symmetric 5 x 5 power.   Data  Reviewed: Basic Metabolic Panel:  Recent Labs Lab 06/07/15 2038  NA 136  K 3.9  CL 98*  CO2 26  GLUCOSE 376*  BUN 14  CREATININE 1.26*  CALCIUM 9.7   Liver Function Tests:  Recent Labs Lab 06/07/15 2236  AST 34  ALT 34  ALKPHOS 67  BILITOT 0.3  PROT 7.3  ALBUMIN 4.1    Recent Labs Lab  06/07/15 2236  LIPASE 334*   No results for input(s): AMMONIA in the last 168 hours. CBC:  Recent Labs Lab 06/07/15 2038  WBC 8.2  HGB 15.9  HCT 45.5  MCV 89.9  PLT 203   Cardiac Enzymes:  Recent Labs Lab 06/08/15 0120 06/08/15 0414  TROPONINI <0.03 <0.03   BNP (last 3 results) No results for input(s): PROBNP in the last 8760 hours. CBG:  Recent Labs Lab 06/08/15 0632 06/08/15 1119  GLUCAP 259* 184*    No results found for this or any previous visit (from the past 240 hour(s)).         Studies: Dg Chest 2 View  06/07/2015  CLINICAL DATA:  Left chest pain EXAM: CHEST  2 VIEW COMPARISON:  None. FINDINGS: The heart size and mediastinal contours are within normal limits. The lungs are hyperinflated. There is patchy consolidation of left mid lung. There is no pulmonary edema or pleural effusion. The visualized skeletal structures are unremarkable. IMPRESSION: Patchy consolidation of the left mid lung, pneumonia is not excluded. Followup after treatment is recommended to ensure resolution. Electronically Signed   By: Sherian Rein M.D.   On: 06/07/2015 20:24   Nm Myocar Multi W/spect W/wall Motion / Ef  06/08/2015  CLINICAL DATA:  Diabetes, hypertension. Tobacco use. Chest pain. Smoking. EXAM: MYOCARDIAL IMAGING WITH SPECT (REST AND PHARMACOLOGIC-STRESS) GATED LEFT VENTRICULAR WALL MOTION STUDY LEFT VENTRICULAR EJECTION FRACTION TECHNIQUE: Standard myocardial SPECT imaging was performed after resting intravenous injection of 10 mCi Tc-7m sestamibi. Subsequently, intravenous infusion of Lexiscan was performed under the supervision of the Cardiology staff. At peak effect of the drug, 30 mCi Tc-27m sestamibi was injected intravenously and standard myocardial SPECT imaging was performed. Quantitative gated imaging was also performed to evaluate left ventricular wall motion, and estimate left ventricular ejection fraction. COMPARISON:  None. FINDINGS: Perfusion: Mildly reduced  inferior wall and septal activity on both stress and rest images, suspicious for low-level scarring. No inducible ischemia. Wall Motion: Normal left ventricular wall motion and wall thickening. Mild end-systolic left ventricular dilatation but end-diastolic volume normal. Left Ventricular Ejection Fraction: 52 % End diastolic volume 130 ml End systolic volume 63 ml IMPRESSION: 1. Suspected mild scarring in the inferior wall and septum. No inducible ischemia. 2. Normal left ventricular wall motion. 3. Left ventricular ejection fraction fifty-two% 4. Low-risk stress test findings*. *2012 Appropriate Use Criteria for Coronary Revascularization Focused Update: J Am Coll Cardiol. 2012;59(9):857-881. http://content.dementiazones.com.aspx?articleid=[email or web address] Electronically Signed   By: Gaylyn Rong M.D.   On: 06/08/2015 12:33        Scheduled Meds: . enoxaparin (LOVENOX) injection  40 mg Subcutaneous Daily  . folic acid  1 mg Oral Daily  . insulin aspart  0-15 Units Subcutaneous 6 times per day  . metoprolol tartrate  25 mg Oral BID  . multivitamin with minerals  1 tablet Oral Daily  . nicotine  21 mg Transdermal Daily  . [START ON 06/09/2015] pneumococcal 23 valent vaccine  0.5 mL Intramuscular Tomorrow-1000  . regadenoson      . thiamine  100 mg Oral Daily   Or  .  thiamine  100 mg Intravenous Daily   Continuous Infusions:   Principal Problem:   Acute pancreatitis Active Problems:   Chest pain of uncertain etiology   Alcohol abuse   Type 2 diabetes mellitus (HCC)   Hypertension    Time spent: 35 minutes.    Marcellus ScottHONGALGI,Davi Kroon, MD, FACP, FHM. [facility] Pager [phone]  If 7PM-7AM, please contact night-coverage [email or web address] Password [email or web address] 06/08/2015, 3:42 PM    LOS: 0 days

## 2015-06-09 DIAGNOSIS — K852 Alcohol induced acute pancreatitis without necrosis or infection: Secondary | ICD-10-CM

## 2015-06-09 LAB — NM MYOCAR MULTI W/SPECT W/WALL MOTION / EF
CHL CUP STRESS STAGE 1 SBP: 120 mmHg
CHL CUP STRESS STAGE 2 SPEED: 0 mph
CHL CUP STRESS STAGE 3 DBP: 81 mmHg
CHL CUP STRESS STAGE 3 GRADE: 0 %
CHL CUP STRESS STAGE 3 HR: 94 {beats}/min
CHL CUP STRESS STAGE 3 SBP: 134 mmHg
CHL CUP STRESS STAGE 3 SPEED: 0 mph
CHL CUP STRESS STAGE 4 HR: 83 {beats}/min
CHL CUP STRESS STAGE 4 SBP: 132 mmHg
CHL CUP STRESS STAGE 5 DBP: 77 mmHg
CHL CUP STRESS STAGE 5 SBP: 125 mmHg
CSEPPBP: 125 mmHg
CSEPPMHR: 51 %
Estimated workload: 1 METS
Peak HR: 86 {beats}/min
Stage 1 DBP: 78 mmHg
Stage 1 Grade: 0 %
Stage 1 HR: 67 {beats}/min
Stage 1 Speed: 0 mph
Stage 2 Grade: 0 %
Stage 2 HR: 67 {beats}/min
Stage 4 DBP: 83 mmHg
Stage 4 Grade: 0 %
Stage 4 Speed: 0 mph
Stage 5 Grade: 0 %
Stage 5 HR: 86 {beats}/min
Stage 5 Speed: 0 mph

## 2015-06-09 LAB — GLUCOSE, CAPILLARY
GLUCOSE-CAPILLARY: 274 mg/dL — AB (ref 65–99)
Glucose-Capillary: 114 mg/dL — ABNORMAL HIGH (ref 65–99)

## 2015-06-09 LAB — BASIC METABOLIC PANEL
ANION GAP: 8 (ref 5–15)
BUN: 10 mg/dL (ref 6–20)
CALCIUM: 9.1 mg/dL (ref 8.9–10.3)
CO2: 26 mmol/L (ref 22–32)
Chloride: 104 mmol/L (ref 101–111)
Creatinine, Ser: 0.82 mg/dL (ref 0.61–1.24)
GFR calc Af Amer: >60 mL/min (ref >60)
Glucose, Bld: 301 mg/dL — ABNORMAL HIGH (ref 65–99)
POTASSIUM: 4 mmol/L (ref 3.5–5.1)
SODIUM: 138 mmol/L (ref 135–145)

## 2015-06-09 LAB — LIPASE, BLOOD: LIPASE: 90 U/L — AB (ref 11–51)

## 2015-06-09 MED ORDER — METFORMIN HCL 500 MG PO TABS: 500.0000 mg | ORAL_TABLET | Freq: Two times a day (BID) | ORAL | Status: DC

## 2015-06-09 MED ORDER — INSULIN GLARGINE 100 UNIT/ML ~~LOC~~ SOLN: 15.0000 [IU] | Freq: Every day | SUBCUTANEOUS | Status: DC

## 2015-06-09 MED ORDER — GLUCOSE BLOOD VI STRP
ORAL_STRIP | Status: DC
Start: 2015-06-09 — End: 2016-04-30

## 2015-06-09 NOTE — Discharge Summary (Signed)
Physician Discharge Summary  Stephen Bird AVW:[id number] DOB: 16-Oct-1960 DOA: 06/07/2015  PCP: No PCP Per Patient  Admit date: 06/07/2015 Discharge date: 06/09/2015  Time spent: greater than 30 minutes  Recommendations for Outpatient Follow-up:  1. Optimize diabetes management 2. Repeat chest xray 4 weeks.   Discharge Diagnoses:  Principal Problem:   Acute pancreatitis, alcohol related Active Problems:   Chest pain of uncertain etiology   Alcohol abuse   Type 2 diabetes mellitus (HCC), insulin dependent, uncontrolled   Hypertension   Discharge Condition: stable  Diet recommendation: carbohydrate modified.  Filed Weights   06/07/15 2008 06/08/15 0300  Weight: 74.39 kg (164 lb) 70.081 kg (154 lb 8 oz)    History of present illness/Hospital Course:  55 year old male with history of DM 2, HTN, HLD, alcohol dependence/heavy and tobacco abuse, [facility] ED on 06/06/14 with complaints of precordial chest pain (sharp, intermittent, associated with left shoulder and upper extremity numbness), epigastric abdominal pain. No history of recent long distance travel or family history of heart disease. Treadmill Stress test 3 [place] ago [place] said to be negative. In the ED, EKG without acute changes, lipase 330 and creatinine 1.26. Hospitalist admission was requested.  Acute alcoholic pancreatitis - Extensively counseled regarding alcohol cessation. Started on bowel rest, IV fluids, pain control. Diet advance. By discharge, feeling much better, tolerating diet and requesting discharge  Stage II chronic kidney disease stable  Chest pain - Initial EKG without acute changes. Troponin 2: Negative. - CAD risk factors: DM, HTN, HLD and tobacco abuse - Cardiology consulted & stress test low risk - Although chest x-ray suggests patchy consolidation in left midlung, no clinical pneumonia. Recommend follow-up chest x-ray in 3-4 weeks to ensure resolution of this finding.  Alcohol dependence -  States that he drinks 3-4 drinks of liquor and sixpack or more of beers daily. Abstinence counseled. No signs of  withdrawal  Tobacco abuse -Cessation counseled. Nicotine patch.  Uncontrolled DM 2 - SSI. Added Lantus. hgb a1c 12.6  Essential hypertension - Controlled.   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  Consultants:  Cardiology  Procedures:  None  Discharge Exam: Filed Vitals:   06/09/15 0607 06/09/15 1100  BP: 136/87 119/82  Pulse: 73 75  Temp: 98.2 F (36.8 C)   Resp: 18     General: a and o Cardiovascular: RRR Respiratory: CTA abd S, NT, ND  Discharge Instructions   Discharge Instructions    Activity as tolerated - No restrictions    Complete by:  As directed      Diet Carb Modified    Complete by:  As directed           Current Discharge Medication List    CONTINUE these medications which have CHANGED   Details  insulin glargine (LANTUS) 100 UNIT/ML injection Inject 0.15 mLs (15 Units total) into the skin at bedtime. Qty: 10 mL, Refills: 11      CONTINUE these medications which have NOT CHANGED   Details  metFORMIN (GLUCOPHAGE) 500 MG tablet Take 1 tablet (500 mg total) by mouth 2 (two) times daily with a meal. Qty: 60 tablet, Refills: 1      STOP taking these medications     cyclobenzaprine (FLEXERIL) 10 MG tablet      hydrochlorothiazide (HYDRODIURIL) 12.5 MG tablet      ibuprofen (ADVIL,MOTRIN) 600 MG  tablet      traMADol (ULTRAM) 50 MG tablet        No Known Allergies Follow-up Information    Follow up with [facility] On 07/02/2015.   Specialty:  Internal Medicine   Why:  at 2:30pm for Medical Doctor Appt   Contact information:   [address] [place] [address] [phone]       The results of significant diagnostics from this hospitalization  (including imaging, microbiology, ancillary and laboratory) are listed below for reference.    Significant Diagnostic Studies: Dg Chest 2 View  06/07/2015  CLINICAL DATA:  Left chest pain EXAM: CHEST  2 VIEW COMPARISON:  None. FINDINGS: The heart size and mediastinal contours are within normal limits. The lungs are hyperinflated. There is patchy consolidation of left mid lung. There is no pulmonary edema or pleural effusion. The visualized skeletal structures are unremarkable. IMPRESSION: Patchy consolidation of the left mid lung, pneumonia is not excluded. Followup after treatment is recommended to ensure resolution. Electronically Signed   By: Sherian Rein M.D.   On: 06/07/2015 20:24   Nm Myocar Multi W/spect W/wall Motion / Ef  06/08/2015  CLINICAL DATA:  Diabetes, hypertension. Tobacco use. Chest pain. Smoking. EXAM: MYOCARDIAL IMAGING WITH SPECT (REST AND PHARMACOLOGIC-STRESS) GATED LEFT VENTRICULAR WALL MOTION STUDY LEFT VENTRICULAR EJECTION FRACTION TECHNIQUE: Standard myocardial SPECT imaging was performed after resting intravenous injection of 10 mCi Tc-26m sestamibi. Subsequently, intravenous infusion of Lexiscan was performed under the supervision of the Cardiology staff. At peak effect of the drug, 30 mCi Tc-43m sestamibi was injected intravenously and standard myocardial SPECT imaging was performed. Quantitative gated imaging was also performed to evaluate left ventricular wall motion, and estimate left ventricular ejection fraction. COMPARISON:  None. FINDINGS: Perfusion: Mildly reduced inferior wall and septal activity on both stress and rest images, suspicious for low-level scarring. No inducible ischemia. Wall Motion: Normal left ventricular wall motion and wall thickening. Mild end-systolic left ventricular dilatation but end-diastolic volume normal. Left Ventricular Ejection Fraction: 52 % End diastolic volume 130 ml End systolic volume 63 ml IMPRESSION: 1. Suspected mild scarring in the  inferior wall and septum. No inducible ischemia. 2. Normal left ventricular wall motion. 3. Left ventricular ejection fraction fifty-two% 4. Low-risk stress test findings*. *2012 Appropriate Use Criteria for Coronary Revascularization Focused Update: J Am Coll Cardiol. 2012;59(9):857-881. http://content.dementiazones.com.aspx?articleid=1201161 Electronically Signed   By: Gaylyn Rong M.D.   On: 06/08/2015 12:33    Microbiology: No results found for this or any previous visit (from the past 240 hour(s)).   Labs: Basic Metabolic Panel:  Recent Labs Lab 06/07/15 2038 06/09/15 0542  NA 136 138  K 3.9 4.0  CL 98* 104  CO2 26 26  GLUCOSE 376* 301*  BUN 14 10  CREATININE 1.26* 0.82  CALCIUM 9.7 9.1   Liver Function Tests:  Recent Labs Lab 06/07/15 2236  AST 34  ALT 34  ALKPHOS 67  BILITOT 0.3  PROT 7.3  ALBUMIN 4.1    Recent Labs Lab 06/07/15 2236 06/09/15 0542  LIPASE 334* 90*   No results for input(s): AMMONIA in the last 168 hours. CBC:  Recent Labs Lab 06/07/15 2038  WBC 8.2  HGB 15.9  HCT 45.5  MCV 89.9  PLT 203   Cardiac Enzymes:  Recent Labs Lab 06/08/15 0120 06/08/15 0414  TROPONINI <0.03 <0.03   BNP: BNP (last 3 results) No results for input(s): BNP in the last 8760 hours.  ProBNP (last 3  results) No results for input(s): PROBNP in the last 8760 hours.  CBG:  Recent Labs Lab 06/08/15 0632 06/08/15 1119 06/08/15 1643 06/08/15 2316 06/09/15 0606  GLUCAP 259* 184* 229* 216* 274*       Signed:  Christiane Ha MD  FACP  [facility] 06/09/2015, 11:43 AM

## 2015-06-09 NOTE — Progress Notes (Signed)
Subjective:  Patient denies any chest pain or shortness of breath. Had nuclear stress test yesterday which showed no evidence of ischemia  with normal ejection fraction. Denies any abdominal pain.  Objective:  Vital Signs in the last 24 hours: Temp:  [97.8 F (36.6 C)-98.2 F (36.8 C)] 98.2 F (36.8 C) (01/07 0607) Pulse Rate:  [68-75] 75 (01/07 1100) Resp:  [18] 18 (01/07 0607) BP: (119-140)/(65-89) 119/82 mmHg (01/07 1100) SpO2:  [95 %-99 %] 97 % (01/07 0607)  Intake/Output from previous day: 01/06 0701 - 01/07 0700 In: 598 [P.O.:598] Out: -  Intake/Output from this shift:    Physical Exam: Neck: no adenopathy, no carotid bruit, no JVD and supple, symmetrical, trachea midline Lungs: clear to auscultation bilaterally Heart: regular rate and rhythm, S1, S2 normal, no murmur, click, rub or gallop Abdomen: soft, non-tender; bowel sounds normal; no masses,  no organomegaly Extremities: extremities normal, atraumatic, no cyanosis or edema  Lab Results:  Recent Labs  06/07/15 2038  WBC 8.2  HGB 15.9  PLT 203    Recent Labs  06/07/15 2038 06/09/15 0542  NA 136 138  K 3.9 4.0  CL 98* 104  CO2 26 26  GLUCOSE 376* 301*  BUN 14 10  CREATININE 1.26* 0.82    Recent Labs  06/08/15 0120 06/08/15 0414  TROPONINI <0.03 <0.03   Hepatic Function Panel  Recent Labs  06/07/15 2236  PROT 7.3  ALBUMIN 4.1  AST 34  ALT 34  ALKPHOS 67  BILITOT 0.3  BILIDIR <0.1*  IBILI NOT CALCULATED   No results for input(s): CHOL in the last 72 hours. No results for input(s): PROTIME in the last 72 hours.  Imaging: Imaging results have been reviewed and Dg Chest 2 View  06/07/2015  CLINICAL DATA:  Left chest pain EXAM: CHEST  2 VIEW COMPARISON:  None. FINDINGS: The heart size and mediastinal contours are within normal limits. The lungs are hyperinflated. There is patchy consolidation of left mid lung. There is no pulmonary edema or pleural effusion. The visualized skeletal  structures are unremarkable. IMPRESSION: Patchy consolidation of the left mid lung, pneumonia is not excluded. Followup after treatment is recommended to ensure resolution. Electronically Signed   By: Sherian Rein M.D.   On: 06/07/2015 20:24   Nm Myocar Multi W/spect W/wall Motion / Ef  06/08/2015  CLINICAL DATA:  Diabetes, hypertension. Tobacco use. Chest pain. Smoking. EXAM: MYOCARDIAL IMAGING WITH SPECT (REST AND PHARMACOLOGIC-STRESS) GATED LEFT VENTRICULAR WALL MOTION STUDY LEFT VENTRICULAR EJECTION FRACTION TECHNIQUE: Standard myocardial SPECT imaging was performed after resting intravenous injection of 10 mCi Tc-32m sestamibi. Subsequently, intravenous infusion of Lexiscan was performed under the supervision of the Cardiology staff. At peak effect of the drug, 30 mCi Tc-76m sestamibi was injected intravenously and standard myocardial SPECT imaging was performed. Quantitative gated imaging was also performed to evaluate left ventricular wall motion, and estimate left ventricular ejection fraction. COMPARISON:  None. FINDINGS: Perfusion: Mildly reduced inferior wall and septal activity on both stress and rest images, suspicious for low-level scarring. No inducible ischemia. Wall Motion: Normal left ventricular wall motion and wall thickening. Mild end-systolic left ventricular dilatation but end-diastolic volume normal. Left Ventricular Ejection Fraction: 52 % End diastolic volume 130 ml End systolic volume 63 ml IMPRESSION: 1. Suspected mild scarring in the inferior wall and septum. No inducible ischemia. 2. Normal left ventricular wall motion. 3. Left ventricular ejection fraction fifty-two% 4. Low-risk stress test findings*. *2012 Appropriate Use Criteria for Coronary Revascularization Focused Update: J Am  Coll Cardiol. 2012;59(9):857-881. http://content.dementiazones.comonlinejacc.org/article.aspx?articleid=1201161 Electronically Signed   By: Gaylyn RongWalter  Liebkemann M.D.   On: 06/08/2015 12:33    Cardiac  Studies:  Assessment/Plan:  Status post Atypical chest pain with some features worrisome for angina MI ruled out  Hypertension Diabetes mellitus Acute pancreatitis EtOH abuse Tobacco abuse Plan Continue present management Okay to discharge from cardiac point of view. Follow-up with me in 2 weeks/when necessary  LOS: 1 day    Rinaldo CloudHarwani, Dakisha Schoof 06/09/2015, 12:17 PM

## 2015-06-09 NOTE — Progress Notes (Signed)
To whom it may concern:   Stephen Bird is unable to work 06/07/15 through 06/09/15 due to illness.   Sincerely, Crista Curborinna Jonah Gingras, MD [facility]

## 2015-06-11 LAB — HEMOGLOBIN A1C
Hgb A1c MFr Bld: 12.6 % — ABNORMAL HIGH (ref 4.8–5.6)
Mean Plasma Glucose: 315 mg/dL

## 2015-07-02 ENCOUNTER — Ambulatory Visit: Payer: Medicaid - Out of State | Admitting: Family Medicine

## 2016-01-02 ENCOUNTER — Encounter (HOSPITAL_BASED_OUTPATIENT_CLINIC_OR_DEPARTMENT_OTHER): Payer: Self-pay | Admitting: *Deleted

## 2016-01-02 ENCOUNTER — Emergency Department (HOSPITAL_BASED_OUTPATIENT_CLINIC_OR_DEPARTMENT_OTHER)
Admission: EM | Admit: 2016-01-02 | Discharge: 2016-01-03 | Disposition: A | Discharge status: Home / Self Care | Payer: Medicaid - Out of State | Attending: Emergency Medicine | Admitting: Emergency Medicine

## 2016-01-02 DIAGNOSIS — E1165 Type 2 diabetes mellitus with hyperglycemia: Secondary | ICD-10-CM | POA: Insufficient documentation

## 2016-01-02 DIAGNOSIS — Z7984 Long term (current) use of oral hypoglycemic drugs: Secondary | ICD-10-CM | POA: Insufficient documentation

## 2016-01-02 DIAGNOSIS — F17210 Nicotine dependence, cigarettes, uncomplicated: Secondary | ICD-10-CM | POA: Insufficient documentation

## 2016-01-02 DIAGNOSIS — R739 Hyperglycemia, unspecified: Secondary | ICD-10-CM

## 2016-01-02 DIAGNOSIS — I10 Essential (primary) hypertension: Secondary | ICD-10-CM | POA: Insufficient documentation

## 2016-01-02 DIAGNOSIS — Z794 Long term (current) use of insulin: Secondary | ICD-10-CM | POA: Insufficient documentation

## 2016-01-02 LAB — CBG MONITORING, ED: Glucose-Capillary: 433 mg/dL — ABNORMAL HIGH (ref 65–99)

## 2016-01-02 NOTE — ED Triage Notes (Addendum)
Pt is from [facility]  471, pt is not receiving his insulin at [facility]

## 2016-01-03 ENCOUNTER — Encounter (HOSPITAL_BASED_OUTPATIENT_CLINIC_OR_DEPARTMENT_OTHER): Payer: Self-pay | Admitting: Emergency Medicine

## 2016-01-03 LAB — CBC WITH DIFFERENTIAL/PLATELET
BASOS ABS: 0 10*3/uL (ref 0.0–0.1)
BASOS PCT: 0 %
Eosinophils Absolute: 0.2 10*3/uL (ref 0.0–0.7)
Eosinophils Relative: 6 %
HEMATOCRIT: 41.5 % (ref 39.0–52.0)
HEMOGLOBIN: 15 g/dL (ref 13.0–17.0)
Lymphocytes Relative: 27 %
Lymphs Abs: 1 10*3/uL (ref 0.7–4.0)
MCH: 31.8 pg (ref 26.0–34.0)
MCHC: 36.1 g/dL — ABNORMAL HIGH (ref 30.0–36.0)
MCV: 87.9 fL (ref 78.0–100.0)
Monocytes Absolute: 0.4 10*3/uL (ref 0.1–1.0)
Monocytes Relative: 12 %
NEUTROS ABS: 2 10*3/uL (ref 1.7–7.7)
NEUTROS PCT: 55 %
Platelets: 189 10*3/uL (ref 150–400)
RBC: 4.72 MIL/uL (ref 4.22–5.81)
RDW: 13.2 % (ref 11.5–15.5)
WBC: 3.7 10*3/uL — ABNORMAL LOW (ref 4.0–10.5)

## 2016-01-03 LAB — URINALYSIS, ROUTINE W REFLEX MICROSCOPIC
Bilirubin Urine: NEGATIVE
Glucose, UA: >1000 mg/dL — AB
Hgb urine dipstick: NEGATIVE
Ketones, ur: NEGATIVE mg/dL
LEUKOCYTES UA: NEGATIVE
Nitrite: NEGATIVE
PROTEIN: NEGATIVE mg/dL
SPECIFIC GRAVITY, URINE: 1.038 — AB (ref 1.005–1.030)
pH: 6 (ref 5.0–8.0)

## 2016-01-03 LAB — URINE MICROSCOPIC-ADD ON: BACTERIA UA: NONE SEEN

## 2016-01-03 LAB — BASIC METABOLIC PANEL
ANION GAP: 11 (ref 5–15)
BUN: 15 mg/dL (ref 6–20)
CALCIUM: 9.4 mg/dL (ref 8.9–10.3)
CO2: 28 mmol/L (ref 22–32)
Chloride: 94 mmol/L — ABNORMAL LOW (ref 101–111)
Creatinine, Ser: 0.84 mg/dL (ref 0.61–1.24)
Glucose, Bld: 318 mg/dL — ABNORMAL HIGH (ref 65–99)
Potassium: 3.3 mmol/L — ABNORMAL LOW (ref 3.5–5.1)
Sodium: 133 mmol/L — ABNORMAL LOW (ref 135–145)

## 2016-01-03 LAB — CBG MONITORING, ED
GLUCOSE-CAPILLARY: 307 mg/dL — AB (ref 65–99)
Glucose-Capillary: 255 mg/dL — ABNORMAL HIGH (ref 65–99)

## 2016-01-03 MED ORDER — INSULIN REGULAR HUMAN 100 UNIT/ML IJ SOLN
4.0000 [IU] | Freq: Once | INTRAMUSCULAR | Status: AC
  Administered 2016-01-03: 4 [IU] via INTRAVENOUS
  Filled 2016-01-03: qty 1

## 2016-01-03 MED ORDER — METFORMIN HCL 500 MG PO TABS
500.0000 mg | ORAL_TABLET | Freq: Two times a day (BID) | ORAL | Status: DC
  Administered 2016-01-03: 500 mg via ORAL
  Filled 2016-01-03: qty 1

## 2016-01-03 MED ORDER — METFORMIN HCL 500 MG PO TABS: 1000.0000 mg | ORAL_TABLET | Freq: Two times a day (BID) | ORAL | 0 refills | Status: DC

## 2016-01-03 MED ORDER — SODIUM CHLORIDE 0.9 % IV BOLUS (SEPSIS)
1000.0000 mL | Freq: Once | INTRAVENOUS | Status: AC
  Administered 2016-01-03: 1000 mL via INTRAVENOUS

## 2016-01-03 NOTE — ED Notes (Signed)
[facility] contacted, "will be here shortly to pick pt up".

## 2016-01-03 NOTE — ED Provider Notes (Addendum)
[facility] Provider Note   CSN: [id number] Arrival date & time: 01/02/16  2244  First Provider Contact:  First MD Initiated Contact with Patient 01/03/16 0049        History   Chief Complaint Chief Complaint  Patient presents with  . Hyperglycemia    HPI Stephen Bird is a 55 y.o. male.  The history is provided by the patient.  Hyperglycemia  Severity:  Moderate Onset quality:  Gradual Timing:  Constant Progression:  Worsening Chronicity:  Chronic Diabetes status:  Controlled with oral medications (has not been on insulin in months.  Only checking sugars BID not carb limiting) Context: not new diabetes diagnosis   Relieved by:  Nothing Ineffective treatments:  None tried Associated symptoms: no dizziness and no fever   Risk factors: no pregnancy     Past Medical History:  Diagnosis Date  . Diabetes mellitus without complication (HCC)   . Hypertension     Patient Active Problem List   Diagnosis Date Noted  . Acute pancreatitis 06/08/2015  . Chest pain of uncertain etiology 06/08/2015  . Alcohol abuse 06/08/2015  . Type 2 diabetes mellitus (HCC) 06/08/2015  . Hypertension 06/08/2015  . Alcohol-induced acute pancreatitis     Past Surgical History:  Procedure Laterality Date  . BACK SURGERY         Home Medications    Prior to Admission medications   Medication Sig Start Date End Date Taking? Authorizing Provider  glucose blood test strip Use as instructed 06/09/15   Christiane Ha, MD  insulin glargine (LANTUS) 100 UNIT/ML injection Inject 0.15 mLs (15 Units total) into the skin at bedtime. 06/09/15   Christiane Ha, MD  metFORMIN (GLUCOPHAGE) 500 MG tablet Take 1 tablet (500 mg total) by mouth 2 (two) times daily with a meal. 06/09/15   Christiane Ha, MD    Family History Family History  Problem Relation Age of Onset  . Family history unknown: Yes    Social History Social History  Substance Use Topics  . Smoking status:  Current Every Day Smoker    Packs/day: 1.00    Types: Cigarettes  . Smokeless tobacco: Not on file  . Alcohol use Yes     Comment: "alot"     Allergies   Review of patient's allergies indicates no known allergies.   Review of Systems Review of Systems  Constitutional: Negative for fever.  Genitourinary: Negative for difficulty urinating.  Neurological: Negative for dizziness.  All other systems reviewed and are negative.    Physical Exam Updated Vital Signs BP 140/97 (BP Location: Right Arm)   Pulse 93   Temp 98.2 F (36.8 C) (Oral)   Resp 16   Ht 5\' 11"  (1.803 m)   Wt 143 lb (64.9 kg)   SpO2 100%   BMI 19.94 kg/m   Physical Exam  Constitutional: He is oriented to person, place, and time. He appears well-developed and well-nourished. No distress.  HENT:  Head: Normocephalic and atraumatic.  Nose: Nose normal.  Eyes: EOM are normal. Pupils are equal, round, and reactive to light.  Neck: Normal range of motion. Neck supple.  Cardiovascular: Normal rate, regular rhythm, normal heart sounds and intact distal pulses.   Pulmonary/Chest: Effort normal and breath sounds normal. No respiratory distress. He has no wheezes. He has no rales.  Abdominal: Soft. Bowel sounds are normal. He exhibits no mass. There is no tenderness. There is no rebound and no guarding.  Musculoskeletal: Normal range of motion.  Neurological: He is alert and oriented to person, place, and time. He has normal reflexes.  Skin: Skin is warm and dry. Capillary refill takes less than 2 seconds.  Psychiatric: He has a normal mood and affect.     ED Treatments / Results  Labs (all labs ordered are listed, but only abnormal results are displayed) Labs Reviewed  CBC WITH DIFFERENTIAL/PLATELET - Abnormal; Notable for the following:       Result Value   WBC 3.7 (*)    MCHC 36.1 (*)    All other components within normal limits  CBG MONITORING, ED - Abnormal; Notable for the following:     Glucose-Capillary 433 (*)    All other components within normal limits  BASIC METABOLIC PANEL  URINALYSIS, ROUTINE W REFLEX MICROSCOPIC (NOT AT [facility])    EKG  EKG Interpretation None       Radiology No results found.  Procedures Procedures (including critical care time)  Medications Ordered in ED Medications - No data to display   Initial Impression / Assessment and Plan / ED Course  I have reviewed the triage vital signs and the nursing notes.  Pertinent labs & imaging results that were available during my care of the patient were reviewed by me and considered in my medical decision making (see chart for details).  Clinical Course    Vitals:   01/02/16 2308  BP: 140/97  Pulse: 93  Resp: 16  Temp: 98.2 F (36.8 C)     Final Clinical Impressions(s) / ED Diagnoses   Final diagnoses:  None   Results for orders placed or performed during the hospital encounter of 01/02/16  Basic metabolic panel  Result Value Ref Range   Sodium 133 (L) 135 - 145 mmol/L   Potassium 3.3 (L) 3.5 - 5.1 mmol/L   Chloride 94 (L) 101 - 111 mmol/L   CO2 28 22 - 32 mmol/L   Glucose, Bld 318 (H) 65 - 99 mg/dL   BUN 15 6 - 20 mg/dL   Creatinine, Ser 9.14 0.61 - 1.24 mg/dL   Calcium 9.4 8.9 - 78.2 mg/dL   GFR calc non Af Amer >60 >60 mL/min   GFR calc Af Amer >60 >60 mL/min   Anion gap 11 5 - 15  CBC with Differential  Result Value Ref Range   WBC 3.7 (L) 4.0 - 10.5 K/uL   RBC 4.72 4.22 - 5.81 MIL/uL   Hemoglobin 15.0 13.0 - 17.0 g/dL   HCT 95.6 21.3 - 08.6 %   MCV 87.9 78.0 - 100.0 fL   MCH 31.8 26.0 - 34.0 pg   MCHC 36.1 (H) 30.0 - 36.0 g/dL   RDW 57.8 46.9 - 62.9 %   Platelets 189 150 - 400 K/uL   Neutrophils Relative % 55 %   Neutro Abs 2.0 1.7 - 7.7 K/uL   Lymphocytes Relative 27 %   Lymphs Abs 1.0 0.7 - 4.0 K/uL   Monocytes Relative 12 %   Monocytes Absolute 0.4 0.1 - 1.0 K/uL   Eosinophils Relative 6 %   Eosinophils Absolute 0.2 0.0 - 0.7 K/uL   Basophils Relative 0  %   Basophils Absolute 0.0 0.0 - 0.1 K/uL  Urinalysis, Routine w reflex microscopic (not at [facility])  Result Value Ref Range   Color, Urine YELLOW YELLOW   APPearance CLEAR CLEAR   Specific Gravity, Urine 1.038 (H) 1.005 - 1.030   pH 6.0 5.0 - 8.0   Glucose, UA >1000 (A) NEGATIVE mg/dL  Hgb urine dipstick NEGATIVE NEGATIVE   Bilirubin Urine NEGATIVE NEGATIVE   Ketones, ur NEGATIVE NEGATIVE mg/dL   Protein, ur NEGATIVE NEGATIVE mg/dL   Nitrite NEGATIVE NEGATIVE   Leukocytes, UA NEGATIVE NEGATIVE  Urine microscopic-add on  Result Value Ref Range   Squamous Epithelial / LPF 0-5 (A) NONE SEEN   WBC, UA 0-5 0 - 5 WBC/hpf   RBC / HPF 0-5 0 - 5 RBC/hpf   Bacteria, UA NONE SEEN NONE SEEN  POC CBG, ED  Result Value Ref Range   Glucose-Capillary 433 (H) 65 - 99 mg/dL   No results found. Medications  metFORMIN (GLUCOPHAGE) tablet 500 mg (500 mg Oral Given 01/03/16 0129)  insulin regular (NOVOLIN R,HUMULIN R) 100 units/mL injection 4 Units (not administered)  sodium chloride 0.9 % bolus 1,000 mL (1,000 mLs Intravenous New Bag/Given 01/03/16 0132)    I cannot find any records of a hemoglobin A1C.  Patient will need to follow up with a PMD or endocrinologist to restart lantus and needs to adhere to a low carb diet which he is not.  Will increase metformin to 100 mg BID New Prescriptions New Prescriptions   No medications on file   All questions answered to patient's satisfaction. Based on history and exam patient has been appropriately medically screened and emergency conditions excluded. Patient is stable for discharge at this time. Follow up with your PMDfor recheck in 2 daysand strict return precautions given.  I personally performed the services described in this documentation, which was scribed in my presence. The recorded information has been reviewed and is accurate.      Cy Blamer, MD 01/03/16 0117    Airabella Barley, MD 01/03/16 [phone]

## 2016-01-03 NOTE — ED Notes (Signed)
Dr.Palumbo into room, at BS.  

## 2016-01-03 NOTE — ED Notes (Signed)
C/o high BS. States, "I need IVF and lantus", taking metformin 500mg  BID and took (1) PTA. States, "Metformin alone isn't working for me". (denies: fever, nvd or pain).

## 2016-01-25 ENCOUNTER — Emergency Department (HOSPITAL_BASED_OUTPATIENT_CLINIC_OR_DEPARTMENT_OTHER)
Admission: EM | Admit: 2016-01-25 | Discharge: 2016-01-26 | Disposition: A | Discharge status: Home / Self Care | Payer: Medicaid - Out of State | Attending: Emergency Medicine | Admitting: Emergency Medicine

## 2016-01-25 ENCOUNTER — Encounter (HOSPITAL_BASED_OUTPATIENT_CLINIC_OR_DEPARTMENT_OTHER): Payer: Self-pay | Admitting: *Deleted

## 2016-01-25 DIAGNOSIS — E1165 Type 2 diabetes mellitus with hyperglycemia: Secondary | ICD-10-CM | POA: Insufficient documentation

## 2016-01-25 DIAGNOSIS — Z794 Long term (current) use of insulin: Secondary | ICD-10-CM | POA: Insufficient documentation

## 2016-01-25 DIAGNOSIS — R739 Hyperglycemia, unspecified: Secondary | ICD-10-CM

## 2016-01-25 DIAGNOSIS — F17210 Nicotine dependence, cigarettes, uncomplicated: Secondary | ICD-10-CM | POA: Insufficient documentation

## 2016-01-25 DIAGNOSIS — I10 Essential (primary) hypertension: Secondary | ICD-10-CM | POA: Insufficient documentation

## 2016-01-25 DIAGNOSIS — Z7984 Long term (current) use of oral hypoglycemic drugs: Secondary | ICD-10-CM | POA: Insufficient documentation

## 2016-01-25 LAB — URINE MICROSCOPIC-ADD ON

## 2016-01-25 LAB — URINALYSIS, ROUTINE W REFLEX MICROSCOPIC
Bilirubin Urine: NEGATIVE
HGB URINE DIPSTICK: NEGATIVE
Ketones, ur: NEGATIVE mg/dL
LEUKOCYTES UA: NEGATIVE
Nitrite: NEGATIVE
PH: 6.5 (ref 5.0–8.0)
PROTEIN: NEGATIVE mg/dL
Specific Gravity, Urine: 1.038 — ABNORMAL HIGH (ref 1.005–1.030)

## 2016-01-25 LAB — CBG MONITORING, ED: GLUCOSE-CAPILLARY: 527 mg/dL — AB (ref 65–99)

## 2016-01-25 MED ORDER — SODIUM CHLORIDE 0.9 % IV BOLUS (SEPSIS)
2000.0000 mL | Freq: Once | INTRAVENOUS | Status: AC
  Administered 2016-01-26: 2000 mL via INTRAVENOUS

## 2016-01-25 NOTE — ED Triage Notes (Signed)
[facility]. Here for elevated blood sugar of 437 tonight. No symptoms. He did not take his Metformin at 5pm and ate pizza.

## 2016-01-25 NOTE — ED Provider Notes (Signed)
[facility] Provider Note   CSN: [id number] Arrival date & time: 01/25/16  2249  By signing my name below, I, Soijett Blue, attest that this documentation has been prepared under the direction and in the presence of Tilden Fossa, MD. Electronically Signed: Soijett Blue, ED Scribe. 01/25/16. 11:24 PM.    History   Chief Complaint Chief Complaint  Patient presents with  . Hyperglycemia    HPI  Stephen Bird is a 55 y.o. male with a medical hx of DM and HTN, who presents to the Emergency Department complaining of hyperglycemia onset 3 weeks. Pt states that for the past 4 months, he hasn't been taking his DM medications properly. Pt notes that he has run out of his lantus pens and he isn't allowed to take the lantus pen while at [facility]. Pt states that he is on 1000 mg Metformin BID at this time. Pt states that his blood sugar at night is typically at 437 range and 220 in the morning. Pt reports that he is a patient at [facility] treatment center. Pt states that he has a doctor appointment on 01/30/2016 for evaluation of his diabetes and medication change. He states that he has not tried any medications for the relief for his symptoms. He denies HA, appetite change, difficulty urinating, constipation, fever, chills, color change, rash, wound, and any other symptoms.  Symptoms are mild and constant.  The history is provided by the patient. No language interpreter was used.    Past Medical History:  Diagnosis Date  . Diabetes mellitus without complication (HCC)   . Hypertension     Patient Active Problem List   Diagnosis Date Noted  . Acute pancreatitis 06/08/2015  . Chest pain of uncertain etiology 06/08/2015  . Alcohol abuse 06/08/2015  . Type 2 diabetes mellitus (HCC) 06/08/2015  . Hypertension 06/08/2015  . Alcohol-induced acute pancreatitis     Past Surgical History:  Procedure Laterality Date  . BACK SURGERY         Home Medications    Prior to Admission  medications   Medication Sig Start Date End Date Taking? Authorizing Provider  glipiZIDE (GLUCOTROL) 5 MG tablet Take 1 tablet (5 mg total) by mouth daily before breakfast. 01/26/16   Tilden Fossa, MD  glucose blood test strip Use as instructed 06/09/15   Christiane Ha, MD  insulin glargine (LANTUS) 100 UNIT/ML injection Inject 0.15 mLs (15 Units total) into the skin at bedtime. 06/09/15   Christiane Ha, MD  metFORMIN (GLUCOPHAGE) 500 MG tablet Take 2 tablets (1,000 mg total) by mouth 2 (two) times daily with a meal. 01/03/16   April Palumbo, MD    Family History Family History  Problem Relation Age of Onset  . Family history unknown: Yes    Social History Social History  Substance Use Topics  . Smoking status: Current Every Day Smoker    Packs/day: 1.00    Types: Cigarettes  . Smokeless tobacco: Never Used  . Alcohol use Yes     Comment: "alot"     Allergies   Review of patient's allergies indicates no known allergies.   Review of Systems Review of Systems  Constitutional: Negative for appetite change, chills and fever.  Gastrointestinal: Negative for constipation.  Genitourinary: Negative for difficulty urinating.  Skin: Negative for color change, rash and wound.  Neurological: Negative for headaches.     Physical Exam Updated Vital Signs BP 130/94   Pulse 80   Temp 98.5 F (36.9 C) (Oral)  Resp 20   Ht 5\' 11"  (1.803 m)   Wt 143 lb (64.9 kg)   SpO2 99%   BMI 19.94 kg/m   Physical Exam  Constitutional: He is oriented to person, place, and time. He appears well-developed and well-nourished. No distress.  HENT:  Head: Normocephalic and atraumatic.  Eyes: EOM are normal.  Cardiovascular: Normal rate, regular rhythm and normal heart sounds.  Exam reveals no gallop and no friction rub.   No murmur heard. Pulmonary/Chest: Effort normal and breath sounds normal. No respiratory distress. He has no wheezes. He has no rales.  Abdominal: Soft. He exhibits no  distension. There is no tenderness.  Musculoskeletal: Normal range of motion.  Neurological: He is alert and oriented to person, place, and time.  Skin: Skin is warm and dry.  Psychiatric: He has a normal mood and affect. His behavior is normal.  Nursing note and vitals reviewed.    ED Treatments / Results  DIAGNOSTIC STUDIES: Oxygen Saturation is 100% on RA, nl by my interpretation.    COORDINATION OF CARE: 11:24 PM Discussed treatment plan with pt at bedside which includes UA, labs, IV fluids, and pt agreed to plan.   Labs (all labs ordered are listed, but only abnormal results are displayed) Labs Reviewed  URINALYSIS, ROUTINE W REFLEX MICROSCOPIC (NOT AT [facility]) - Abnormal; Notable for the following:       Result Value   Specific Gravity, Urine 1.038 (*)    Glucose, UA >1000 (*)    All other components within normal limits  COMPREHENSIVE METABOLIC PANEL - Abnormal; Notable for the following:    Sodium 134 (*)    Chloride 99 (*)    Glucose, Bld 553 (*)    ALT 14 (*)    All other components within normal limits  CBC WITH DIFFERENTIAL/PLATELET - Abnormal; Notable for the following:    HCT 38.4 (*)    All other components within normal limits  URINE MICROSCOPIC-ADD ON - Abnormal; Notable for the following:    Squamous Epithelial / LPF 0-5 (*)    Bacteria, UA RARE (*)    All other components within normal limits  CBG MONITORING, ED - Abnormal; Notable for the following:    Glucose-Capillary 527 (*)    All other components within normal limits  CBG MONITORING, ED - Abnormal; Notable for the following:    Glucose-Capillary 389 (*)    All other components within normal limits     Procedures Procedures (including critical care time)  Medications Ordered in ED Medications  sodium chloride 0.9 % bolus 2,000 mL (0 mLs Intravenous Stopped 01/26/16 0249)     Initial Impression / Assessment and Plan / ED Course  I have reviewed the triage vital signs and the nursing  notes.  Pertinent labs that were available during my care of the patient were reviewed by me and considered in my medical decision making (see chart for details).  Clinical Course    Patient with history of diabetes here for evaluation of asymptomatic hyperglycemia. Blood sugar significantly elevated in the emergency department without any evidence of DKA or significant electrolyte abnormalities. We will add glipizide to his regimen with continued diabetic diet and PCP follow-up. Home care and return precautions were discussed.  Final Clinical Impressions(s) / ED Diagnoses   Final diagnoses:  Hyperglycemia    New Prescriptions Discharge Medication List as of 01/26/2016  2:42 AM    START taking these medications   Details  glipiZIDE (GLUCOTROL) 5 MG tablet Take  1 tablet (5 mg total) by mouth daily before breakfast., Starting Sat 01/26/2016, Print        I personally performed the services described in this documentation, which was scribed in my presence. The recorded information has been reviewed and is accurate.     Tilden Fossa, MD 01/26/16 [id number]

## 2016-01-26 LAB — CBC WITH DIFFERENTIAL/PLATELET
BASOS PCT: 0 %
Basophils Absolute: 0 10*3/uL (ref 0.0–0.1)
EOS ABS: 0.6 10*3/uL (ref 0.0–0.7)
EOS PCT: 12 %
HCT: 38.4 % — ABNORMAL LOW (ref 39.0–52.0)
Hemoglobin: 13.5 g/dL (ref 13.0–17.0)
LYMPHS ABS: 1.5 10*3/uL (ref 0.7–4.0)
Lymphocytes Relative: 28 %
MCH: 31.4 pg (ref 26.0–34.0)
MCHC: 35.2 g/dL (ref 30.0–36.0)
MCV: 89.3 fL (ref 78.0–100.0)
MONOS PCT: 9 %
Monocytes Absolute: 0.5 10*3/uL (ref 0.1–1.0)
Neutro Abs: 2.6 10*3/uL (ref 1.7–7.7)
Neutrophils Relative %: 51 %
PLATELETS: 218 10*3/uL (ref 150–400)
RBC: 4.3 MIL/uL (ref 4.22–5.81)
RDW: 12.2 % (ref 11.5–15.5)
WBC: 5.1 10*3/uL (ref 4.0–10.5)

## 2016-01-26 LAB — COMPREHENSIVE METABOLIC PANEL
ALK PHOS: 77 U/L (ref 38–126)
ALT: 14 U/L — AB (ref 17–63)
AST: 15 U/L (ref 15–41)
Albumin: 4.1 g/dL (ref 3.5–5.0)
Anion gap: 8 (ref 5–15)
BUN: 18 mg/dL (ref 6–20)
CALCIUM: 9.4 mg/dL (ref 8.9–10.3)
CHLORIDE: 99 mmol/L — AB (ref 101–111)
CO2: 27 mmol/L (ref 22–32)
CREATININE: 0.99 mg/dL (ref 0.61–1.24)
Glucose, Bld: 553 mg/dL (ref 65–99)
Potassium: 3.8 mmol/L (ref 3.5–5.1)
SODIUM: 134 mmol/L — AB (ref 135–145)
Total Bilirubin: 0.4 mg/dL (ref 0.3–1.2)
Total Protein: 7.1 g/dL (ref 6.5–8.1)

## 2016-01-26 LAB — CBG MONITORING, ED: GLUCOSE-CAPILLARY: 389 mg/dL — AB (ref 65–99)

## 2016-01-26 MED ORDER — GLIPIZIDE 5 MG PO TABS: 5.0000 mg | ORAL_TABLET | Freq: Every day | ORAL | 0 refills | Status: DC

## 2016-01-26 NOTE — ED Notes (Signed)
EDP has been in to see Pt. And discussed meds with Pt.   Pt. Will be going back to facility [facility] after CBG check after the bolus of NS   Pt. Aware of how to care for self and has no questions for EDP.  Pt. Made aware of eating low carb and low sugar diet if possible.

## 2016-04-30 ENCOUNTER — Emergency Department (HOSPITAL_COMMUNITY): Payer: Medicaid - Out of State

## 2016-04-30 ENCOUNTER — Encounter (HOSPITAL_COMMUNITY): Payer: Self-pay

## 2016-04-30 ENCOUNTER — Emergency Department (HOSPITAL_COMMUNITY)
Admission: EM | Admit: 2016-04-30 | Discharge: 2016-04-30 | Disposition: A | Discharge status: Home / Self Care | Payer: Medicaid - Out of State | Attending: Emergency Medicine | Admitting: Emergency Medicine

## 2016-04-30 DIAGNOSIS — F17210 Nicotine dependence, cigarettes, uncomplicated: Secondary | ICD-10-CM | POA: Insufficient documentation

## 2016-04-30 DIAGNOSIS — E1142 Type 2 diabetes mellitus with diabetic polyneuropathy: Secondary | ICD-10-CM | POA: Diagnosis not present

## 2016-04-30 DIAGNOSIS — Y999 Unspecified external cause status: Secondary | ICD-10-CM | POA: Insufficient documentation

## 2016-04-30 DIAGNOSIS — E119 Type 2 diabetes mellitus without complications: Secondary | ICD-10-CM | POA: Insufficient documentation

## 2016-04-30 DIAGNOSIS — W1830XA Fall on same level, unspecified, initial encounter: Secondary | ICD-10-CM | POA: Insufficient documentation

## 2016-04-30 DIAGNOSIS — Y929 Unspecified place or not applicable: Secondary | ICD-10-CM | POA: Insufficient documentation

## 2016-04-30 DIAGNOSIS — R739 Hyperglycemia, unspecified: Secondary | ICD-10-CM

## 2016-04-30 DIAGNOSIS — E1165 Type 2 diabetes mellitus with hyperglycemia: Secondary | ICD-10-CM

## 2016-04-30 DIAGNOSIS — R2990 Unspecified symptoms and signs involving the nervous system: Secondary | ICD-10-CM

## 2016-04-30 DIAGNOSIS — Z79899 Other long term (current) drug therapy: Secondary | ICD-10-CM | POA: Insufficient documentation

## 2016-04-30 DIAGNOSIS — Z794 Long term (current) use of insulin: Secondary | ICD-10-CM | POA: Insufficient documentation

## 2016-04-30 DIAGNOSIS — I10 Essential (primary) hypertension: Secondary | ICD-10-CM | POA: Insufficient documentation

## 2016-04-30 DIAGNOSIS — Y939 Activity, unspecified: Secondary | ICD-10-CM | POA: Insufficient documentation

## 2016-04-30 DIAGNOSIS — R200 Anesthesia of skin: Secondary | ICD-10-CM | POA: Insufficient documentation

## 2016-04-30 LAB — CBG MONITORING, ED
Glucose-Capillary: 309 mg/dL — ABNORMAL HIGH (ref 65–99)
Glucose-Capillary: 274 mg/dL — ABNORMAL HIGH (ref 65–99)

## 2016-04-30 LAB — COMPREHENSIVE METABOLIC PANEL
ALBUMIN: 3.3 g/dL — AB (ref 3.5–5.0)
ALK PHOS: 64 U/L (ref 38–126)
ALT: 19 U/L (ref 17–63)
ANION GAP: 11 (ref 5–15)
AST: 34 U/L (ref 15–41)
BILIRUBIN TOTAL: 0.4 mg/dL (ref 0.3–1.2)
BUN: 13 mg/dL (ref 6–20)
CALCIUM: 8.9 mg/dL (ref 8.9–10.3)
CO2: 22 mmol/L (ref 22–32)
Chloride: 99 mmol/L — ABNORMAL LOW (ref 101–111)
Creatinine, Ser: 1.04 mg/dL (ref 0.61–1.24)
GLUCOSE: 775 mg/dL — AB (ref 65–99)
Potassium: 4.3 mmol/L (ref 3.5–5.1)
Sodium: 132 mmol/L — ABNORMAL LOW (ref 135–145)
TOTAL PROTEIN: 6.1 g/dL — AB (ref 6.5–8.1)

## 2016-04-30 LAB — DIFFERENTIAL
BASOS ABS: 0 10*3/uL (ref 0.0–0.1)
Basophils Relative: 0 %
EOS ABS: 0.1 10*3/uL (ref 0.0–0.7)
EOS PCT: 2 %
LYMPHS ABS: 0.8 10*3/uL (ref 0.7–4.0)
LYMPHS PCT: 18 %
Monocytes Absolute: 0.3 10*3/uL (ref 0.1–1.0)
Monocytes Relative: 6 %
NEUTROS PCT: 74 %
Neutro Abs: 3.4 10*3/uL (ref 1.7–7.7)

## 2016-04-30 LAB — URINALYSIS, ROUTINE W REFLEX MICROSCOPIC
Bilirubin Urine: NEGATIVE
Glucose, UA: >1000 mg/dL — AB
Hgb urine dipstick: NEGATIVE
KETONES UR: NEGATIVE mg/dL
LEUKOCYTES UA: NEGATIVE
NITRITE: NEGATIVE
PH: 6 (ref 5.0–8.0)
PROTEIN: NEGATIVE mg/dL
Specific Gravity, Urine: 1.031 — ABNORMAL HIGH (ref 1.005–1.030)

## 2016-04-30 LAB — I-STAT TROPONIN, ED: Troponin i, poc: 0 ng/mL (ref 0.00–0.08)

## 2016-04-30 LAB — CBC
HCT: 38.7 % — ABNORMAL LOW (ref 39.0–52.0)
HEMOGLOBIN: 13.2 g/dL (ref 13.0–17.0)
MCH: 31.2 pg (ref 26.0–34.0)
MCHC: 34.1 g/dL (ref 30.0–36.0)
MCV: 91.5 fL (ref 78.0–100.0)
PLATELETS: 212 10*3/uL (ref 150–400)
RBC: 4.23 MIL/uL (ref 4.22–5.81)
RDW: 12.9 % (ref 11.5–15.5)
WBC: 4.6 10*3/uL (ref 4.0–10.5)

## 2016-04-30 LAB — RAPID URINE DRUG SCREEN, HOSP PERFORMED
Amphetamines: NOT DETECTED
Barbiturates: NOT DETECTED
Benzodiazepines: NOT DETECTED
Cocaine: NOT DETECTED
OPIATES: NOT DETECTED
TETRAHYDROCANNABINOL: NOT DETECTED

## 2016-04-30 LAB — PROTIME-INR
INR: 1.03
PROTHROMBIN TIME: 13.5 s (ref 11.4–15.2)

## 2016-04-30 LAB — LIPASE, BLOOD: Lipase: 62 U/L — ABNORMAL HIGH (ref 11–51)

## 2016-04-30 LAB — I-STAT CHEM 8, ED
BUN: 15 mg/dL (ref 6–20)
CREATININE: 0.8 mg/dL (ref 0.61–1.24)
Calcium, Ion: 1.13 mmol/L — ABNORMAL LOW (ref 1.15–1.40)
Chloride: 97 mmol/L — ABNORMAL LOW (ref 101–111)
Glucose, Bld: >700 mg/dL (ref 65–99)
HEMATOCRIT: 42 % (ref 39.0–52.0)
Hemoglobin: 14.3 g/dL (ref 13.0–17.0)
POTASSIUM: 4.3 mmol/L (ref 3.5–5.1)
Sodium: 133 mmol/L — ABNORMAL LOW (ref 135–145)
TCO2: 23 mmol/L (ref 0–100)

## 2016-04-30 LAB — URINE MICROSCOPIC-ADD ON
BACTERIA UA: NONE SEEN
WBC, UA: NONE SEEN WBC/hpf (ref 0–5)

## 2016-04-30 LAB — APTT: APTT: 33 s (ref 24–36)

## 2016-04-30 LAB — ETHANOL: Alcohol, Ethyl (B): <5 mg/dL (ref <5)

## 2016-04-30 MED ORDER — INSULIN ASPART 100 UNIT/ML ~~LOC~~ SOLN
10.0000 [IU] | Freq: Once | SUBCUTANEOUS | Status: AC
  Administered 2016-04-30: 10 [IU] via INTRAVENOUS
  Filled 2016-04-30: qty 1

## 2016-04-30 MED ORDER — METFORMIN HCL 500 MG PO TABS: 1000.0000 mg | ORAL_TABLET | Freq: Two times a day (BID) | ORAL | 0 refills | Status: DC

## 2016-04-30 MED ORDER — SODIUM CHLORIDE 0.9 % IV BOLUS (SEPSIS)
1000.0000 mL | Freq: Once | INTRAVENOUS | Status: AC
  Administered 2016-04-30: 1000 mL via INTRAVENOUS

## 2016-04-30 NOTE — ED Notes (Signed)
Pt CBG 309 notified Public relations account executiveHolley (RN)

## 2016-04-30 NOTE — ED Provider Notes (Signed)
[facility]PT Provider Note   CSN: [id number] Arrival date & time: 04/30/16  1151     History   Chief Complaint Chief Complaint  Patient presents with  . Fall  . Numbness    HPI Laren Evertsric Cai is a 55 y.o. male.  Pt presents to the ED today with a code stroke.  The pt was at work when he passed out.  He had some numbness to his hands as well.  The pt is a diabetic, but has not been taking his medications and he does not have any.  He does not have a pcp.  Pt does not follow a diabetic diet.  He said that he ate pancakes with pink lemonade for breakfast.      Past Medical History:  Diagnosis Date  . Diabetes mellitus without complication (HCC)   . Hypertension     Patient Active Problem List   Diagnosis Date Noted  . Acute pancreatitis 06/08/2015  . Chest pain of uncertain etiology 06/08/2015  . Alcohol abuse 06/08/2015  . Type 2 diabetes mellitus (HCC) 06/08/2015  . Hypertension 06/08/2015  . Alcohol-induced acute pancreatitis     Past Surgical History:  Procedure Laterality Date  . BACK SURGERY         Home Medications    Prior to Admission medications   Medication Sig Start Date End Date Taking? Authorizing Provider  glipiZIDE (GLUCOTROL) 5 MG tablet Take 1 tablet (5 mg total) by mouth daily before breakfast. Patient not taking: Reported on 04/30/2016 01/26/16   Tilden FossaElizabeth Rees, MD  hydrochlorothiazide (HYDRODIURIL) 25 MG tablet Take 25 mg by mouth daily at 2 PM. 12/31/15   Historical Provider, MD  insulin glargine (LANTUS) 100 UNIT/ML injection Inject 0.15 mLs (15 Units total) into the skin at bedtime. Patient not taking: Reported on 04/30/2016 06/09/15   Christiane Haorinna L Sullivan, MD  metFORMIN (GLUCOPHAGE) 500 MG tablet Take 2 tablets (1,000 mg total) by mouth 2 (two) times daily with a meal. 04/30/16   Jacalyn LefevreJulie Marcille Barman, MD    Family History Family History  Problem Relation Age of Onset  . Family history unknown: Yes    Social History Social History    Substance Use Topics  . Smoking status: Current Every Day Smoker    Packs/day: 1.00    Types: Cigarettes  . Smokeless tobacco: Never Used  . Alcohol use Yes     Comment: "alot"     Allergies   Patient has no known allergies.   Review of Systems Review of Systems  Neurological: Positive for syncope and numbness.  All other systems reviewed and are negative.    Physical Exam Updated Vital Signs BP 135/92   Pulse 78   Temp 99.3 F (37.4 C)   Resp 23   Ht 5\' 11"  (1.803 m)   Wt 144 lb 2.9 oz (65.4 kg)   SpO2 100%   BMI 20.11 kg/m   Physical Exam  Constitutional: He is oriented to person, place, and time. He appears well-developed and well-nourished.  HENT:  Head: Normocephalic.  Right Ear: External ear normal.  Left Ear: External ear normal.  Nose: Nose normal.  Mouth/Throat: Oropharynx is clear and moist.  Eyes: Conjunctivae and EOM are normal. Pupils are equal, round, and reactive to light.  Neck: Normal range of motion. Neck supple.  Cardiovascular: Normal rate, regular rhythm, normal heart sounds and intact distal pulses.   Pulmonary/Chest: Effort normal and breath sounds normal.  Abdominal: Soft. Bowel sounds are normal.  Musculoskeletal: Normal range  of motion.  Neurological: He is alert and oriented to person, place, and time.  Skin: Skin is warm.  Psychiatric: He has a normal mood and affect. His behavior is normal. Judgment and thought content normal.  Nursing note and vitals reviewed.    ED Treatments / Results  Labs (all labs ordered are listed, but only abnormal results are displayed) Labs Reviewed  CBC - Abnormal; Notable for the following:       Result Value   HCT 38.7 (*)    All other components within normal limits  COMPREHENSIVE METABOLIC PANEL - Abnormal; Notable for the following:    Sodium 132 (*)    Chloride 99 (*)    Glucose, Bld 775 (*)    Total Protein 6.1 (*)    Albumin 3.3 (*)    All other components within normal limits   URINALYSIS, ROUTINE W REFLEX MICROSCOPIC (NOT AT [facility]) - Abnormal; Notable for the following:    Specific Gravity, Urine 1.031 (*)    Glucose, UA >1000 (*)    All other components within normal limits  LIPASE, BLOOD - Abnormal; Notable for the following:    Lipase 62 (*)    All other components within normal limits  URINE MICROSCOPIC-ADD ON - Abnormal; Notable for the following:    Squamous Epithelial / LPF 0-5 (*)    All other components within normal limits  I-STAT CHEM 8, ED - Abnormal; Notable for the following:    Sodium 133 (*)    Chloride 97 (*)    Glucose, Bld >700 (*)    Calcium, Ion 1.13 (*)    All other components within normal limits  CBG MONITORING, ED - Abnormal; Notable for the following:    Glucose-Capillary 309 (*)    All other components within normal limits  CBG MONITORING, ED - Abnormal; Notable for the following:    Glucose-Capillary 274 (*)    All other components within normal limits  ETHANOL  PROTIME-INR  APTT  DIFFERENTIAL  RAPID URINE DRUG SCREEN, HOSP PERFORMED  I-STAT TROPOININ, ED  CBG MONITORING, ED    EKG  EKG Interpretation  Date/Time:  Wednesday April 30 2016 12:20:20 EST Ventricular Rate:  78 PR Interval:    QRS Duration: 94 QT Interval:  411 QTC Calculation: 469 R Axis:   84 Text Interpretation:  Sinus rhythm Anterior infarct, possibly acute ST elevation, consider inferior injury Lateral leads are also involved No significant change since last tracing Confirmed by [facility] MD, Xoey Warmoth ([id number]) on 04/30/2016 12:27:27 PM Also confirmed by [facility], Frona Yost ([id number]), editor Joseph ArtWoods, Ivor Messierora ([id number])  on 04/30/2016 12:34:59 PM       Radiology Ct Cervical Spine Wo Contrast  Result Date: 04/30/2016 CLINICAL DATA:  Fall and left-sided weakness. EXAM: CT CERVICAL SPINE WITHOUT CONTRAST TECHNIQUE: Multidetector CT imaging of the cervical spine was performed without intravenous contrast. Multiplanar CT image reconstructions were also generated.  COMPARISON:  None. FINDINGS: Alignment: Normal. Skull base and vertebrae: Negative for a fracture or dislocation. Soft tissues and spinal canal: No prevertebral fluid or swelling. No visible canal hematoma. Disc levels: Disc space narrowing and endplate changes at C5-C6. Endplate changes and disc space narrowing at C6-C7. Vertebral body heights are maintained. Left paracentral disc osteophyte complex at C4-C5 possibly causing narrowing of the left neural foramen at this level. Upper chest: Patchy densities at lung apices may be related to scarring. There appears to be some paraseptal emphysematous changes at the lung apices but incompletely visualized. Other: None. IMPRESSION: No  acute bone abnormality in cervical spine. Degenerative disc disease in the cervical spine. Left paracentral disc osteophyte complex at C4-C5. Scarring and possible emphysematous changes at the lung apices. Electronically Signed   By: Richarda Overlie M.D.   On: 04/30/2016 12:22   Ct Head Code Stroke Wo Contrast`  Result Date: 04/30/2016 CLINICAL DATA:  Code stroke. Code stroke. Left-sided weakness. Blurred vision. Weakness with fall. EXAM: CT HEAD WITHOUT CONTRAST TECHNIQUE: Contiguous axial images were obtained from the base of the skull through the vertex without intravenous contrast. COMPARISON:  None. FINDINGS: Brain: The brainstem is normal. No cerebellar abnormality. There are areas of low-density in the cerebral hemispheric white matter consistent with chronic small vessel ischemic change. No sign of acute cortical infarction or basal ganglia infarction. No mass lesion, hemorrhage, hydrocephalus or extra-axial collection. Vascular: No abnormal vascular finding. Skull: Normal Sinuses/Orbits: Clear/normal Other: None ASPECTS (Alberta Stroke Program Early CT Score) - Ganglionic level infarction (caudate, lentiform nuclei, internal capsule, insula, M1-M3 cortex): 7 - Supraganglionic infarction (M4-M6 cortex): 3 Total score (0-10 with 10  being normal): 10 IMPRESSION: 1. No acute finding. Chronic appearing small vessel change of the cerebral hemispheric white matter, premature for age. 2. ASPECTS is 10. These results were called by telephone at the time of interpretation on 04/30/2016 at 12:04 pm to Dr. Roxy Manns, who verbally acknowledged these results. Electronically Signed   By: Paulina Fusi M.D.   On: 04/30/2016 12:05    Procedures Procedures (including critical care time)  Medications Ordered in ED Medications  sodium chloride 0.9 % bolus 1,000 mL (1,000 mLs Intravenous New Bag/Given 04/30/16 1210)  insulin aspart (novoLOG) injection 10 Units (10 Units Intravenous Given 04/30/16 1210)     Initial Impression / Assessment and Plan / ED Course  I have reviewed the triage vital signs and the nursing notes.  Pertinent labs & imaging results that were available during my care of the patient were reviewed by me and considered in my medical decision making (see chart for details).  Clinical Course    Pt seen by Dr. Roxy Manns (neurology) for the code stroke.  Pt has no lateralizing features and is awake and alert.  Dr. Roxy Manns cancelled the code stroke.  Pt's sx likely due to extremely elevated blood glucose level.  He was d/w case management as he is without a doctor.  She [facility]  The pt knows that it is very important that he go to that appt.  I gave him a refill of metformin for 1 month.  Pt knows to return if worse.  Final Clinical Impressions(s) / ED Diagnoses   Final diagnoses:  Poorly controlled type 2 diabetes mellitus [facility])    New Prescriptions Current Discharge Medication List       Jacalyn Lefevre, MD 04/30/16 1431

## 2016-04-30 NOTE — Consult Note (Signed)
Neurology Consult Note  Reason for Consultation: CODE STROKE   Requesting provider: Activated by EMS in field, ED MD Stephen Bird  CC: Increased tingling in fingers and toes  HPI: This is a 42-yo RH man who presents to [name] ED via EMS as a CODE STROKE. History is obtained directly from the patient who is an excellent historian.  He reports that he went to work at about 0845 this morning. Shortly after arriving at work, he noted increased tingling in the fingers and toes bilaterally. He states that he has had some chronic tingling in all of his digits, worse on the left, but this got worse this morning. He denies any associated weakness, vision changes, difficulty speaking, difficulty swallowing, or balance problems. He shook his hands out but the tingling persisted. A while later he states that he passed out. He denies any premonitory dizziness, lightheadedness, or diaphoresis. He is not sure how long he was out but thinks it was a short time. He denies any confusion or lethargy on waking. EMS was activated and report that the patient complained of left sided numbness. Based on this, he was brought to the ED as a CODE STROKE. EMS reports that they were unable to obtain his blood sugar because the value was too high for their meter to read.   I met the patient on his arrival in the ED with the rest of the stroke team. He was taken for an emergent CT of the head which did not show any acute abnormality. His neurologic exam was nonfocal with NIHSS score 0. Blood glucose was 775. CODE STROKE was canceled as it was felt that his presentation was not likely to be related to stroke.   On further questioning, he states that he has had some weakness and numbness in the LLE for about three months. This is described as a tendency for the knee to give out, particularly when he is going up stairs. He also feels that his left thigh is "getting smaller." He denies any associated pain in the thigh. He denies  any back pain or radicular pain. He denies any back injury.   Last known well: 0845 NHISS score: 0 mRS score: 0 tPA given?: No, symptoms not consistent with stroke   PMH:  Past Medical History:  Diagnosis Date  . Diabetes mellitus without complication ([place])   . Hypertension     PSH:  Past Surgical History:  Procedure Laterality Date  . BACK SURGERY      Family history: Family History  Problem Relation Age of Onset  . Family history unknown: Yes    Social history:  Social History   Social History  . Marital status: Single    Spouse name: N/A  . Number of children: N/A  . Years of education: N/A   Occupational History  . Not on file.   Social History Main Topics  . Smoking status: Current Every Day Smoker    Packs/day: 1.00    Types: Cigarettes  . Smokeless tobacco: Never Used  . Alcohol use Yes     Comment: "alot"  . Drug use:     Types: Cocaine     Comment: heroin  . Sexual activity: Yes   Other Topics Concern  . Not on file   Social History Narrative  . No narrative on file    Current outpatient meds: No outpatient prescriptions have been marked as taking for the 04/30/16 encounter [facility] Encounter).    Current inpatient meds:  No current facility-administered medications for this encounter.    Current Outpatient Prescriptions  Medication Sig Dispense Refill  . glipiZIDE (GLUCOTROL) 5 MG tablet Take 1 tablet (5 mg total) by mouth daily before breakfast. 14 tablet 0  . glucose blood test strip Use as instructed 100 each 0  . insulin glargine (LANTUS) 100 UNIT/ML injection Inject 0.15 mLs (15 Units total) into the skin at bedtime. 10 mL 11  . metFORMIN (GLUCOPHAGE) 500 MG tablet Take 2 tablets (1,000 mg total) by mouth 2 (two) times daily with a meal. 60 tablet 0    Allergies: No Known Allergies  ROS: As per HPI. A full 14-point review of systems was performed and is otherwise unremarkable.   PE:  BP 132/77 (BP Location: Left Arm)    Pulse 78   Temp 99.3 F (37.4 C) (Oral)   Resp 21   Wt 65.4 kg (144 lb 2.9 oz)   SpO2 100%   BMI 20.11 kg/m   General: Thin AA man lying on ED gurney, no acute distress. AAO x4. Speech clear, no dysarthria. No aphasia. Follows commands briskly. Affect is bright with congruent mood. Comportment is normal.  HEENT: Normocephalic. Neck supple without LAD. MMM, OP clear. Dentition good. Sclerae anicteric. No conjunctival injection.  CV: Regular, no murmur. Carotid pulses full and symmetric, no bruits. Distal pulses 2+ and symmetric.  Lungs: CTAB.  Abdomen: Soft, non-distended, non-tender. Bowel sounds present x4.  Extremities: No C/C/E. Neuro:  CN: Pupils are equal and round. They are symmetrically reactive from 3-->2 mm. Visual fields are full to confrontation. EOMI without nystagmus. No reported diplopia. Facial sensation is intact to light touch. Face is symmetric at rest with normal strength and mobility. Hearing is intact to conversational voice. Palate elevates symmetrically and uvula is midline. Voice is normal in tone, pitch and quality. Bilateral SCM and trapezii are 5/5. Tongue is midline with normal bulk and mobility.  Motor: Thin bulk throughout, normal tone and strength. No tremor or other abnormal movements. No drift.  Sensation: Intact to light touch. Pinprick and vibration are diminished in both LEs to about midshin on the L and to the ankle on the R.  DTRs: 2+ biceps, o/w absent. Toes downgoing bilaterally. No pathologic reflexes.  Coordination: Finger-to-nose and heel-to-shin are without dysmetria. Finger taps are normal in amplitude and speed, no decrement.    Labs:  Lab Results  Component Value Date   WBC 4.6 04/30/2016   HGB 14.3 04/30/2016   HCT 42.0 04/30/2016   PLT 212 04/30/2016   GLUCOSE >700 (HH) 04/30/2016   ALT 14 (L) 01/25/2016   AST 15 01/25/2016   NA 133 (L) 04/30/2016   K 4.3 04/30/2016   CL 97 (L) 04/30/2016   CREATININE 0.80 04/30/2016   BUN 15  04/30/2016   CO2 27 01/25/2016   INR 1.03 04/30/2016   HGBA1C 12.6 (H) 06/08/2015   Troponin 0.00 Lipase 62 EtOH <5  Imaging:  I have personally and independently reviewed the [facility] without contrast from today. This shows a moderate burden of patchy hypodensities in the bihemispheric white matter that are most consistent with chronic small vessel ischemic disease. No obvious acute abnormality is noted.   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 His glucose remains poorly controlled as evidence by today's labs and recent ED visits for hyperglycemia. Ensure aggressive glucose control moving forward to minimize further nerve damage. His neuropathy is  slightly asymmetric, extending higher on the LLE than the RLE, but this is not unusual.   2. Hyperglycemia: His glucose is 775 in the ED. This could explain his increased tingling in the fingers and toes and his syncope. Continue to treat. He is not endorsing any acute lateralizing deficits and has no focal findings on exam, arguing against TIA and stroke, so I would defer further stroke workup at this time.   This was discussed with the patient and he is in agreement with the plan as noted. He was given the opportunity to ask any questions and these were addressed to his satisfaction.   I have no further recommendations at this time and will sign off. Call if any new issues arise.

## 2016-04-30 NOTE — ED Notes (Signed)
ED Provider at bedside. 

## 2016-04-30 NOTE — ED Triage Notes (Signed)
Pt BIB [facility] from work. Pt reports feeling fine around 0845, at about 0900 pt reports having numbness in his left hand and left foot and right foot that radiated up to elbow and knee. Pt reports syncopal episode and fall. Pt. Does not remember fall. EMS attempted to obtain CBG, reading HIGH. Pt. Reports taking DM medications last night but not this am because he "ran out". A&OX4 upon arrival. NAD. VSS.

## 2016-04-30 NOTE — Care Management Note (Signed)
Case Management Note  Patient Details  Name: Stephen Bird MRN: [id number] Date of Birth: [birth year]  Subjective/Objective:                  25-yo man who presents to [name] ED via EMS as a CODE STROKE. /From home alone.  Action/Plan: Follow for disposition needs. /PCP establishment and medication assistance.   Expected Discharge Date:  04/30/16               Expected Discharge Plan:  Home/Self Care  In-House Referral:  NA  Discharge planning Services  CM Consult  Post Acute Care Choice:  NA Choice offered to:  NA  DME Arranged:  N/A DME Agency:  NA  HH Arranged:  NA HH Agency:  NA  Status of Service:  Completed, signed off  If discussed at [place] of Stay Meetings, dates discussed:    Additional Comments: [name] J. Clydene Laming, RN, BSN, [place] [phone] ER CM consulted regarding PCP establishment and insurance enrollment. Pt presented to [facility] ER today with high blood sugar. NCM met with pt at bedside; pt confirms not having access to f/u care with PCP or insurance coverage. Discussed with patient importance and benefits of establishing PCP, and not utilizing the ER for primary care needs. Pt verbalized understanding and is in agreement.  NCM advised that Internal Medicine providers at [facility] are accepting new pts that are uninsured. Pt verbalized understanding and asked to have appointment arranged. NCM set up appointment at  with [name], RN on 12/20 at 0900.  NCM informed pt they may visit [name] and [name] for Rx needs upon discharge.  NCM provided business card of [name], [place] Specialist and instructed to call with question or concerns pertaining P4[facility]/orange card process.  Pt verbalized understanding.  [name], RN 04/30/2016, 2:05 PM

## 2016-04-30 NOTE — Code Documentation (Signed)
The patient is a 55 yo male who [facility] via [facility] as a Code Stroke. The patient was at work this morning when he had a syncopal episode and fell. He did report some numbness to his hands and feet with more numbness in the distal parts of the extremities prior to the syncopal episode. The patient stated that the numbness started around 0845. He also stated that he had some blurry vision that has since began to improve. Upon arrival to the ED, labs were drawn and the pt was taken for a CT scan of his head and cervical spine. Upon assessment, pt reported some numbness in his fingers and toes. He also reported some numbness to his left upper leg. The left upper leg is said to be something that has been bothering him for the last 3 months. NIHSS completed. See documentation of NIHSS and code stroke times. On assessment, pt was alert and had no focal deficits. tPA not given d/t symptoms too mild. Neuro MD canceled code stroke. Bed side handoff with ED RN Asher MuirJamie.

## 2016-04-30 NOTE — ED Notes (Signed)
Pt ambulatory at discharge without difficulty, refused wheelchair. No neuro deficits at this time. A/O x4. Verbalizes understanding of discharge instructions.

## 2016-04-30 NOTE — ED Notes (Signed)
Pt's CBG 274.  Informed Asher MuirJamie, RN and [place], Charity fundraiserN.

## 2016-05-21 ENCOUNTER — Other Ambulatory Visit: Payer: Self-pay

## 2016-05-21 ENCOUNTER — Ambulatory Visit (INDEPENDENT_AMBULATORY_CARE_PROVIDER_SITE_OTHER): Payer: Self-pay | Admitting: Family Medicine

## 2016-05-21 ENCOUNTER — Encounter: Payer: Self-pay | Admitting: Family Medicine

## 2016-05-21 VITALS — BP 136/93 | HR 98 | Temp 97.7°F | Resp 18 | Ht 71.0 in | Wt 143.0 lb

## 2016-05-21 DIAGNOSIS — E118 Type 2 diabetes mellitus with unspecified complications: Secondary | ICD-10-CM

## 2016-05-21 DIAGNOSIS — F32A Depression, unspecified: Secondary | ICD-10-CM | POA: Insufficient documentation

## 2016-05-21 DIAGNOSIS — F1110 Opioid abuse, uncomplicated: Secondary | ICD-10-CM

## 2016-05-21 DIAGNOSIS — F329 Major depressive disorder, single episode, unspecified: Secondary | ICD-10-CM

## 2016-05-21 DIAGNOSIS — E1149 Type 2 diabetes mellitus with other diabetic neurological complication: Secondary | ICD-10-CM

## 2016-05-21 DIAGNOSIS — F17200 Nicotine dependence, unspecified, uncomplicated: Secondary | ICD-10-CM

## 2016-05-21 DIAGNOSIS — I10 Essential (primary) hypertension: Secondary | ICD-10-CM

## 2016-05-21 LAB — COMPLETE METABOLIC PANEL WITH GFR
ALBUMIN: 3.7 g/dL (ref 3.6–5.1)
ALK PHOS: 77 U/L (ref 40–115)
ALT: 17 U/L (ref 9–46)
AST: 20 U/L (ref 10–35)
BILIRUBIN TOTAL: 0.3 mg/dL (ref 0.2–1.2)
BUN: 12 mg/dL (ref 7–25)
CALCIUM: 8.9 mg/dL (ref 8.6–10.3)
CO2: 26 mmol/L (ref 20–31)
CREATININE: 0.82 mg/dL (ref 0.70–1.33)
Chloride: 101 mmol/L (ref 98–110)
Glucose, Bld: 432 mg/dL — ABNORMAL HIGH (ref 65–99)
Potassium: 4.1 mmol/L (ref 3.5–5.3)
Sodium: 136 mmol/L (ref 135–146)
TOTAL PROTEIN: 6.3 g/dL (ref 6.1–8.1)

## 2016-05-21 LAB — LIPID PANEL
CHOLESTEROL: 155 mg/dL (ref <200)
HDL: 91 mg/dL (ref >40)
LDL Cholesterol: 52 mg/dL (ref <100)
TRIGLYCERIDES: 61 mg/dL (ref <150)
Total CHOL/HDL Ratio: 1.7 Ratio (ref <5.0)
VLDL: 12 mg/dL (ref <30)

## 2016-05-21 LAB — CBC WITH DIFFERENTIAL/PLATELET
BASOS ABS: 0 {cells}/uL (ref 0–200)
Basophils Relative: 0 %
EOS ABS: 165 {cells}/uL (ref 15–500)
Eosinophils Relative: 3 %
HEMATOCRIT: 43.6 % (ref 38.5–50.0)
Hemoglobin: 14.4 g/dL (ref 13.2–17.1)
LYMPHS PCT: 16 %
Lymphs Abs: 880 cells/uL (ref 850–3900)
MCH: 30.8 pg (ref 27.0–33.0)
MCHC: 33 g/dL (ref 32.0–36.0)
MCV: 93.4 fL (ref 80.0–100.0)
MONO ABS: 385 {cells}/uL (ref 200–950)
MONOS PCT: 7 %
MPV: 10.6 fL (ref 7.5–12.5)
NEUTROS ABS: 4070 {cells}/uL (ref 1500–7800)
Neutrophils Relative %: 74 %
PLATELETS: 228 10*3/uL (ref 140–400)
RBC: 4.67 MIL/uL (ref 4.20–5.80)
RDW: 13.2 % (ref 11.0–15.0)
WBC: 5.5 10*3/uL (ref 3.8–10.8)

## 2016-05-21 LAB — POCT URINALYSIS DIP (DEVICE)
Bilirubin Urine: NEGATIVE
Glucose, UA: >=1000 mg/dL — AB
HGB URINE DIPSTICK: NEGATIVE
Ketones, ur: NEGATIVE mg/dL
Leukocytes, UA: NEGATIVE
NITRITE: NEGATIVE
PH: 7 (ref 5.0–8.0)
Protein, ur: NEGATIVE mg/dL
Specific Gravity, Urine: 1.015 (ref 1.005–1.030)
UROBILINOGEN UA: 0.2 mg/dL (ref 0.0–1.0)

## 2016-05-21 LAB — POCT GLYCOSYLATED HEMOGLOBIN (HGB A1C): Hemoglobin A1C: 14.7

## 2016-05-21 MED ORDER — INSULIN GLARGINE 100 UNIT/ML ~~LOC~~ SOLN: 20.0000 [IU] | Freq: Every day | SUBCUTANEOUS | 11 refills | Status: DC

## 2016-05-21 MED ORDER — METFORMIN HCL 500 MG PO TABS: 1000.0000 mg | ORAL_TABLET | Freq: Two times a day (BID) | ORAL | 1 refills | Status: DC

## 2016-05-21 MED ORDER — TRUE METRIX AIR GLUCOSE METER W/DEVICE KIT: 1.0000 | PACK | Freq: Three times a day (TID) | 0 refills | Status: AC

## 2016-05-21 MED ORDER — DULOXETINE HCL 20 MG PO CPEP: 20.0000 mg | ORAL_CAPSULE | Freq: Every day | ORAL | 3 refills | Status: DC

## 2016-05-21 MED ORDER — LISINOPRIL 10 MG PO TABS: 10.0000 mg | ORAL_TABLET | Freq: Every day | ORAL | 3 refills | Status: DC

## 2016-05-21 MED ORDER — GLUCOSE BLOOD VI STRP: ORAL_STRIP | 12 refills | Status: DC

## 2016-05-21 MED ORDER — "INSULIN SYRINGE 30G X 5/16"" 0.3 ML MISC": 1.0000 | Freq: Every day | 12 refills | Status: DC

## 2016-05-21 MED ORDER — GABAPENTIN 300 MG PO CAPS: 300.0000 mg | ORAL_CAPSULE | Freq: Three times a day (TID) | ORAL | 1 refills | Status: DC

## 2016-05-21 MED ORDER — GLUCOSE BLOOD VI STRP
ORAL_STRIP | 12 refills | Status: DC
Start: 2016-05-21 — End: 2016-05-21

## 2016-05-21 MED ORDER — GLIPIZIDE 5 MG PO TABS: 5.0000 mg | ORAL_TABLET | Freq: Every day | ORAL | 0 refills | Status: DC

## 2016-05-21 MED ORDER — TRUE METRIX AIR GLUCOSE METER W/DEVICE KIT: 1.0000 | PACK | Freq: Three times a day (TID) | 0 refills | Status: DC

## 2016-05-21 MED FILL — !LANTUS 100 UNITS/ML VIAL: 100 | 50 days supply | Qty: 10 | Fill #0

## 2016-05-21 MED FILL — LISINOPRIL 10 MG TABLET: 10 | 30 days supply | Qty: 30 | Fill #0

## 2016-05-21 MED FILL — TRUE METRIX TEST STRIP: 25 days supply | Qty: 100 | Fill #0

## 2016-05-21 MED FILL — DULoxetine HCL 20 MG CPEP: 20 | 30 days supply | Qty: 30 | Fill #0

## 2016-05-21 MED FILL — TRUE METRIX BLOOD GLUCOSE M: W/DEVICE | 1 days supply | Qty: 1 | Fill #0

## 2016-05-21 MED FILL — TRUEPLUS SYR 0.3ML 30GX5/16: 30G X 5/16" | 25 days supply | Qty: 100 | Fill #0

## 2016-05-21 MED FILL — GABAPENTIN 300 MG CAPSULE: 300 | 30 days supply | Qty: 90 | Fill #0

## 2016-05-21 NOTE — Addendum Note (Signed)
Addended by: Loney HeringBATTEN, Korby Ratay E on: 05/21/2016 04:07 PM   Modules accepted: Orders

## 2016-05-21 NOTE — Telephone Encounter (Signed)
Sent lantus into correct pharmacy. Thanks!

## 2016-05-21 NOTE — Progress Notes (Signed)
Subjective:    Patient ID: Stephen Bird, male    DOB: January 25, 1961, 55 y.o.   MRN: [id number]  HPI Stephen Bird, a 54 year old male with a history of uncontrolled diabetes type 2, hypertension, depression, and drug abuse presents to establish care. He has not been followed by a primary provider due to insurance constraints. He has primarily been using the emergency department for all primary needs. Symptoms include: paresthesia of the feet and visual disturbances. Symptoms have been intermittent. Patient denies foot ulcerations and weight loss.   Stephen Bird also has a history of hypertension. He says that he has been out of medication over the past several months. He has been a tobacco user for the past several years and has made several attempts to quit.  He is not exercising and is not adherent to low salt diet. He does not check blood pressure at home.   Patient denies chest pain, dyspnea, orthopnea, palpitations, syncope and tachypnea.  Cardiovascular risk factors include: diabetes mellitus, male gender and sedentary lifestyle.  Stephen Bird also has a history of depression. He says that depression is primarily related to his living situation. He is currently residing at a shelter. He states that his current employment is temporary and he is constantly stressed about money. He says that he feels hopeless on most days. Onset of depression symptoms was 1 year ago.  He denies current suicidal and homicidal plan or intent. He has not sought counseling and has never been on anti-depression medications.   Past Medical History:  Diagnosis Date  . Diabetes mellitus without complication ([place])   . Hypertension    Social History   Social History Narrative  . No narrative on file   Immunization History  Administered Date(s) Administered  . Influenza Split 04/11/2015  . Pneumococcal Polysaccharide-23 06/09/2015  No Known Allergies   Review of Systems  Constitutional: Positive for fatigue and unexpected  weight change (weight loss). Negative for fever.  Eyes: Positive for photophobia.  Respiratory: Negative.   Cardiovascular: Negative.  Negative for chest pain and palpitations.  Gastrointestinal: Negative.   Endocrine: Negative for polydipsia, polyphagia and polyuria.  Genitourinary: Negative.   Musculoskeletal: Positive for myalgias.  Skin: Negative.   Allergic/Immunologic: Negative.  Negative for immunocompromised state.  Neurological: Positive for numbness. Negative for dizziness and weakness.  Hematological: Negative.   Psychiatric/Behavioral: Positive for decreased concentration. Negative for suicidal ideas. The patient is nervous/anxious.        Depression       Objective:   Physical Exam  Constitutional: He is oriented to person, place, and time. He appears well-developed and well-nourished.  HENT:  Head: Normocephalic and atraumatic.  Right Ear: External ear normal.  Left Ear: External ear normal.  Mouth/Throat: Oropharynx is clear and moist.  Eyes: Conjunctivae are normal. Pupils are equal, round, and reactive to light.  Neck: Normal range of motion. Neck supple.  Cardiovascular: Normal rate, regular rhythm, normal heart sounds and intact distal pulses.   Pulmonary/Chest: Effort normal and breath sounds normal.  Abdominal: Soft. Bowel sounds are normal.  Musculoskeletal: Normal range of motion.  Neurological: He is alert and oriented to person, place, and time. He has normal strength and normal reflexes. He displays a negative Romberg sign.  Monofilament exam normal  Skin: Skin is warm and dry.  Psychiatric: He exhibits a depressed mood. He expresses no homicidal and no suicidal ideation.         BP (!) 136/93 (BP Location: Right Arm,  Patient Position: Sitting, Cuff Size: Normal)   Pulse 98   Temp 97.7 F (36.5 C) (Oral)   Resp 18   Ht 5' 11"  (1.803 m)   Wt 143 lb (64.9 kg)   SpO2 100%   BMI 19.94 kg/m  Assessment & Plan:  1. Type 2 diabetes mellitus with  complication, unspecified long term insulin use status (HCC) Hemoglobin a1C is 14.7. Will re-start antidiabetic medications. Will increase Lantus to 20 units HS. Patient to check blood sugar fasting in am and post prandial. Recommend a lowfat, low carbohydrate diet divided over 5-6 small meals, increase water intake to 6-8 glasses, and 150 minutes per week of cardiovascular exercise.   - POCT urinalysis dip (device) - HgB A1c - CBC with Differential - insulin glargine (LANTUS) 100 UNIT/ML injection; Inject 0.2 mLs (20 Units total) into the skin at bedtime.  Dispense: 10 mL; Refill: 11 - metFORMIN (GLUCOPHAGE) 500 MG tablet; Take 2 tablets (1,000 mg total) by mouth 2 (two) times daily with a meal.  Dispense: 180 tablet; Refill: 1 - glipiZIDE (GLUCOTROL) 5 MG tablet; Take 1 tablet (5 mg total) by mouth daily before breakfast.  Dispense: 90 tablet; Refill: 0 - Blood Glucose Monitoring Suppl (TRUE METRIX AIR GLUCOSE METER) w/Device KIT; 1 each by Does not apply route 3 (three) times daily before meals.  Dispense: 1 kit; Refill: 0 - glucose blood (TRUE METRIX BLOOD GLUCOSE TEST) test strip; Use as instructed  Dispense: 100 each; Refill: 12  2. Essential hypertension Blood pressure is mildly above goal. Will start ACE inhibitor today. Patient to return to office in 1 month.  - POCT urinalysis dip (device) - CBC with Differential - COMPLETE METABOLIC PANEL WITH GFR - Lipid Panel - lisinopril (PRINIVIL,ZESTRIL) 10 MG tablet; Take 1 tablet (10 mg total) by mouth daily.  Dispense: 90 tablet; Refill: 3  3. Other diabetic neurological complication associated with type 2 diabetes mellitus ([place]) Will start a trial of gabapentin. To follow up in 1 month - gabapentin (NEURONTIN) 300 MG capsule; Take 1 capsule (300 mg total) by mouth 3 (three) times daily.  Dispense: 90 capsule; Refill: 1  4. Tobacco dependence Smoking cessation instruction/counseling given:  counseled patient on the dangers of tobacco use,  advised patient to stop smoking, and reviewed strategies to maximize success 5. Depression, unspecified depression type Reviewed PHQ-9, which is 11. Patient states that he is unable to attend counseling at this point. He currently resides in a homeless shelter. He denies suicidal or homicidal intent.  - DULoxetine (CYMBALTA) 20 MG capsule; Take 1 capsule (20 mg total) by mouth daily.  Dispense: 30 capsule; Refill: 3  6. Heroin use disorder, mild Patient last used Heroin powder 1 week ago. Recommended [facility]. Patient is not interested in services at this time.     RTC: 1 month for DMII, hypertension, depression  Phillp Dolores M, FNP    The patient was given clear instructions to go to ER or return to medical center if symptoms do not improve, worsen or new problems develop. The patient verbalized understanding. Will notify patient with laboratory results.

## 2016-05-21 NOTE — Patient Instructions (Addendum)
[address] [place], [place] [address]  Diabetes and Foot Care Diabetes may cause you to have problems because of poor blood supply (circulation) to your feet and legs. This may cause the skin on your feet to become thinner, break easier, and heal more slowly. Your skin may become dry, and the skin may peel and crack. You may also have nerve damage in your legs and feet causing decreased feeling in them. You may not notice minor injuries to your feet that could lead to infections or more serious problems. Taking care of your feet is one of the most important things you can do for yourself. Follow these instructions at home:  Wear shoes at all times, even in the house. Do not go barefoot. Bare feet are easily injured.  Check your feet daily for blisters, cuts, and redness. If you cannot see the bottom of your feet, use a mirror or ask someone for help.  Wash your feet with warm water (do not use hot water) and mild soap. Then pat your feet and the areas between your toes until they are completely dry. Do not soak your feet as this can dry your skin.  Apply a moisturizing lotion or petroleum jelly (that does not contain alcohol and is unscented) to the skin on your feet and to dry, brittle toenails. Do not apply lotion between your toes.  Trim your toenails straight across. Do not dig under them or around the cuticle. File the edges of your nails with an emery board or nail file.  Do not cut corns or calluses or try to remove them with medicine.  Wear clean socks or stockings every day. Make sure they are not too tight. Do not wear knee-high stockings since they may decrease blood flow to your legs.  Wear shoes that fit properly and have enough cushioning. To break in new shoes, wear them for just a few hours a day. This prevents you from injuring your feet. Always look in your shoes before you put them on to be sure there are no objects inside.  Do not cross your legs. This may  decrease the blood flow to your feet.  If you find a minor scrape, cut, or break in the skin on your feet, keep it and the skin around it clean and dry. These areas may be cleansed with mild soap and water. Do not cleanse the area with peroxide, alcohol, or iodine.  When you remove an adhesive bandage, be sure not to damage the skin around it.  If you have a wound, look at it several times a day to make sure it is healing.  Do not use heating pads or hot water bottles. They may burn your skin. If you have lost feeling in your feet or legs, you may not know it is happening until it is too late.  Make sure your health care provider performs a complete foot exam at least annually or more often if you have foot problems. Report any cuts, sores, or bruises to your health care provider immediately. Contact a health care provider if:  You have an injury that is not healing.  You have cuts or breaks in the skin.  You have an ingrown nail.  You notice redness on your legs or feet.  You feel burning or tingling in your legs or feet.  You have pain or cramps in your legs and feet.  Your legs or feet are numb.  Your feet always feel cold. Get  help right away if:  There is increasing redness, swelling, or pain in or around a wound.  There is a red line that goes up your leg.  Pus is coming from a wound.  You develop a fever or as directed by your health care provider.  You notice a bad smell coming from an ulcer or wound. This information is not intended to replace advice given to you by your health care provider. Make sure you discuss any questions you have with your health care provider. Document Released: 05/16/2000 Document Revised: 10/25/2015 Document Reviewed: 10/26/2012 Elsevier Interactive Patient Education  2017 [employer] Inc[employer] Diabetes Mellitus and Food It is important for you to manage your blood sugar (glucose) level. Your blood glucose level can be greatly affected by what  you eat. Eating healthier foods in the appropriate amounts throughout the day at about the same time each day will help you control your blood glucose level. It can also help slow or prevent worsening of your diabetes mellitus. Healthy eating may even help you improve the level of your blood pressure and reach or maintain a healthy weight. General recommendations for healthful eating and cooking habits include:  Eating meals and snacks regularly. Avoid going long periods of time without eating to lose weight.  Eating a diet that consists mainly of plant-based foods, such as fruits, vegetables, nuts, legumes, and whole grains.  Using low-heat cooking methods, such as baking, instead of high-heat cooking methods, such as deep frying. Work with your dietitian to make sure you understand how to use the Nutrition Facts information on food labels. How can food affect me? Carbohydrates  Carbohydrates affect your blood glucose level more than any other type of food. Your dietitian will help you determine how many carbohydrates to eat at each meal and teach you how to count carbohydrates. Counting carbohydrates is important to keep your blood glucose at a healthy level, especially if you are using insulin or taking certain medicines for diabetes mellitus. Alcohol  Alcohol can cause sudden decreases in blood glucose (hypoglycemia), especially if you use insulin or take certain medicines for diabetes mellitus. Hypoglycemia can be a life-threatening condition. Symptoms of hypoglycemia (sleepiness, dizziness, and disorientation) are similar to symptoms of having too much alcohol. If your health care provider has given you approval to drink alcohol, do so in moderation and use the following guidelines:  Women should not have more than one drink per day, and men should not have more than two drinks per day. One drink is equal to:  12 oz of beer.  5 oz of wine.  1 oz of hard liquor.  Do not drink on an  empty stomach.  Keep yourself hydrated. Have water, diet soda, or unsweetened iced tea.  Regular soda, juice, and other mixers might contain a lot of carbohydrates and should be counted. What foods are not recommended? As you make food choices, it is important to remember that all foods are not the same. Some foods have fewer nutrients per serving than other foods, even though they might have the same number of calories or carbohydrates. It is difficult to get your body what it needs when you eat foods with fewer nutrients. Examples of foods that you should avoid that are high in calories and carbohydrates but low in nutrients include:  Trans fats (most processed foods list trans fats on the Nutrition Facts label).  Regular soda.  Juice.  Candy.  Sweets, such as cake, pie, doughnuts, and cookies.  Fried foods.  What foods can I eat? Eat nutrient-rich foods, which will nourish your body and keep you healthy. The food you should eat also will depend on several factors, including:  The calories you need.  The medicines you take.  Your weight.  Your blood glucose level.  Your blood pressure level.  Your cholesterol level. You should eat a variety of foods, including:  Protein.  Lean cuts of meat.  Proteins low in saturated fats, such as fish, egg whites, and beans. Avoid processed meats.  Fruits and vegetables.  Fruits and vegetables that may help control blood glucose levels, such as apples, mangoes, and yams.  Dairy products.  Choose fat-free or low-fat dairy products, such as milk, yogurt, and cheese.  Grains, bread, pasta, and rice.  Choose whole grain products, such as multigrain bread, whole oats, and brown rice. These foods may help control blood pressure.  Fats.  Foods containing healthful fats, such as nuts, avocado, olive oil, canola oil, and fish. Does everyone with diabetes mellitus have the same meal plan? Because every person with diabetes mellitus is  different, there is not one meal plan that works for everyone. It is very important that you meet with a dietitian who will help you create a meal plan that is just right for you. This information is not intended to replace advice given to you by your health care provider. Make sure you discuss any questions you have with your health care provider. Document Released: 02/13/2005 Document Revised: 10/25/2015 Document Reviewed: 04/15/2013 Elsevier Interactive Patient Education  2017 Elsevier Inc.  Chemical Dependency Chemical dependency is an addiction to drugs or alcohol. It is characterized by the repeated behavior of seeking out and using drugs and alcohol despite harmful consequences to the health and safety of ones self and others.  RISK FACTORS There are certain situations or behaviors that increase a person's risk for chemical dependency. These include:  A family history of chemical dependency.  A history of mental health issues, including depression and anxiety.  A home environment where drugs and alcohol are easily available to you.  Drug or alcohol use at a young age. SYMPTOMS  The following symptoms can indicate chemical dependency:  Inability to limit the use of drugs or alcohol.  Nausea, sweating, shakiness, and anxiety that occurs when alcohol or drugs are not being used.  An increase in amount of drugs or alcohol that is necessary to get drunk or high. People who experience these symptoms can assess their use of drugs and alcohol by asking themselves the following questions:  Have you been told by friends or family that they are worried about your use of alcohol or drugs?  Do friends and family ever tell you about things you did while drinking alcohol or using drugs that you do not remember?  Do you lie about using alcohol or drugs or about the amounts you use?  Do you have difficulty completing daily tasks unless you use alcohol or drugs?  Is the level of your work or  school performance lower because of your drug or alcohol use?  Do you get sick from using drugs or alcohol but keep using anyway?  Do you feel uncomfortable in social situations unless you use alcohol or drugs?  Do you use drugs or alcohol to help forget problems? An answer of yes to any of these questions may indicate chemical dependency. Professional evaluation is suggested. This information is not intended to replace advice given to you by your health care provider.  Make sure you discuss any questions you have with your health care provider. Document Released: 05/13/2001 Document Revised: 08/11/2011 Document Reviewed: 01/24/2015 Elsevier Interactive Patient Education  2017 [employer]

## 2016-05-21 NOTE — Telephone Encounter (Signed)
Re sent rx for strips and sent in rx for insulin needles. Thanks!

## 2016-05-22 LAB — MICROALBUMIN / CREATININE URINE RATIO
Creatinine, Urine: 34 mg/dL (ref 20–370)
Microalb Creat Ratio: 71 mcg/mg creat — ABNORMAL HIGH (ref <30)
Microalb, Ur: 2.4 mg/dL

## 2016-05-27 ENCOUNTER — Encounter (HOSPITAL_COMMUNITY): Payer: Self-pay | Admitting: Emergency Medicine

## 2016-05-27 ENCOUNTER — Emergency Department (HOSPITAL_COMMUNITY)
Admission: EM | Admit: 2016-05-27 | Discharge: 2016-05-27 | Disposition: A | Discharge status: Home / Self Care | Payer: Self-pay | Attending: Emergency Medicine | Admitting: Emergency Medicine

## 2016-05-27 ENCOUNTER — Emergency Department (HOSPITAL_COMMUNITY): Payer: Self-pay

## 2016-05-27 DIAGNOSIS — E119 Type 2 diabetes mellitus without complications: Secondary | ICD-10-CM | POA: Insufficient documentation

## 2016-05-27 DIAGNOSIS — B9789 Other viral agents as the cause of diseases classified elsewhere: Secondary | ICD-10-CM

## 2016-05-27 DIAGNOSIS — Z79899 Other long term (current) drug therapy: Secondary | ICD-10-CM | POA: Insufficient documentation

## 2016-05-27 DIAGNOSIS — F17210 Nicotine dependence, cigarettes, uncomplicated: Secondary | ICD-10-CM | POA: Insufficient documentation

## 2016-05-27 DIAGNOSIS — Y999 Unspecified external cause status: Secondary | ICD-10-CM | POA: Insufficient documentation

## 2016-05-27 DIAGNOSIS — Z794 Long term (current) use of insulin: Secondary | ICD-10-CM | POA: Insufficient documentation

## 2016-05-27 DIAGNOSIS — J988 Other specified respiratory disorders: Secondary | ICD-10-CM | POA: Insufficient documentation

## 2016-05-27 DIAGNOSIS — I10 Essential (primary) hypertension: Secondary | ICD-10-CM | POA: Insufficient documentation

## 2016-05-27 DIAGNOSIS — Y929 Unspecified place or not applicable: Secondary | ICD-10-CM | POA: Insufficient documentation

## 2016-05-27 DIAGNOSIS — R9341 Abnormal radiologic findings on diagnostic imaging of renal pelvis, ureter, or bladder: Secondary | ICD-10-CM | POA: Insufficient documentation

## 2016-05-27 DIAGNOSIS — R937 Abnormal findings on diagnostic imaging of other parts of musculoskeletal system: Secondary | ICD-10-CM

## 2016-05-27 DIAGNOSIS — Y939 Activity, unspecified: Secondary | ICD-10-CM | POA: Insufficient documentation

## 2016-05-27 DIAGNOSIS — M25551 Pain in right hip: Secondary | ICD-10-CM | POA: Insufficient documentation

## 2016-05-27 DIAGNOSIS — W010XXA Fall on same level from slipping, tripping and stumbling without subsequent striking against object, initial encounter: Secondary | ICD-10-CM | POA: Insufficient documentation

## 2016-05-27 MED ORDER — NAPROXEN 500 MG PO TABS: 500.0000 mg | ORAL_TABLET | Freq: Two times a day (BID) | ORAL | 0 refills | Status: DC

## 2016-05-27 MED ORDER — BENZONATATE 100 MG PO CAPS: 100.0000 mg | ORAL_CAPSULE | Freq: Every evening | ORAL | 0 refills | Status: DC | PRN

## 2016-05-27 MED ORDER — AEROCHAMBER Z-STAT PLUS/MEDIUM MISC
1.0000 | Freq: Once | Status: AC
  Administered 2016-05-27: 1

## 2016-05-27 MED ORDER — GUAIFENESIN-DM 100-10 MG/5ML PO SYRP: 5.0000 mL | ORAL_SOLUTION | ORAL | 0 refills | Status: DC | PRN

## 2016-05-27 MED ORDER — ALBUTEROL SULFATE HFA 108 (90 BASE) MCG/ACT IN AERS
2.0000 | INHALATION_SPRAY | Freq: Once | RESPIRATORY_TRACT | Status: AC
  Administered 2016-05-27: 2 via RESPIRATORY_TRACT
  Filled 2016-05-27: qty 6.7

## 2016-05-27 NOTE — ED Notes (Signed)
Pt called in lobby x 3, not in lobby

## 2016-05-27 NOTE — ED Triage Notes (Signed)
Pt reports right hip pain post fall 3 days ago , also co nasal congestion. sts diabetic and has possible neuropathy [pain . alert and oriented x 4.

## 2016-05-27 NOTE — ED Provider Notes (Signed)
[name] Provider Note   CSN: [id number] Arrival date & time: 05/27/16  1049  By signing my name below, I, Stephen Bird, attest that this documentation has been prepared under the direction and in the presence of non-physician practitioner, Clayton Bibles, PA-C. Electronically Signed: Jeanell Bird, Scribe. 05/27/2016. 11:54 AM.  History   Chief Complaint Chief Complaint  Patient presents with  . Hip Pain    right  . Cough   The history is provided by the patient. No language interpreter was used.   HPI Comments: Stephen Bird is a 55 y.o. male who presents to the Emergency Department complaining of constant moderate right hip pain that started 3 days ago. He states he slipped on grease on fell on the ground onto his RLE. He describes the pain as exacerbated by ambulation. He took [facility] powder without any relief.   Pt also complaints of intermittent moderate cough that started 4 days ago. He reports associated symptoms of subjective fever, diaphoresis, post-tussive vomiting, and nasal congestion. Reports his blood sugars have been in the 100s since his recent PCP appointment.  He admits to having a neuropathy exacerbation currently. He reports no modifying factors for URI-like symptoms. He denies any other injury.     PCP: Stephen Dew, FNP, was seen for appointment on 05/21/16.   Past Medical History:  Diagnosis Date  . Diabetes mellitus without complication ([place])   . Hypertension     Patient Active Problem List   Diagnosis Date Noted  . Depression 05/21/2016  . Acute pancreatitis 06/08/2015  . Chest pain of uncertain etiology 55/97/4163  . Alcohol abuse 06/08/2015  . Type 2 diabetes mellitus ([place]) 06/08/2015  . Hypertension 06/08/2015  . Alcohol-induced acute pancreatitis     Past Surgical History:  Procedure Laterality Date  . BACK SURGERY    . HYDROCELE EXCISION / REPAIR     1998       Home Medications    Prior to Admission medications   Medication  Sig Start Date End Date Taking? Authorizing Provider  benzonatate (TESSALON) 100 MG capsule Take 1 capsule (100 mg total) by mouth at bedtime as needed for cough. 05/27/16   Clayton Bibles, PA-C  Blood Glucose Monitoring Suppl (TRUE METRIX AIR GLUCOSE METER) w/Device KIT 1 each by Does not apply route 3 (three) times daily before meals. 05/21/16   Stephen Dew, FNP  DULoxetine (CYMBALTA) 20 MG capsule Take 1 capsule (20 mg total) by mouth daily. 05/21/16   Stephen Dew, FNP  gabapentin (NEURONTIN) 300 MG capsule Take 1 capsule (300 mg total) by mouth 3 (three) times daily. 05/21/16   Stephen Dew, FNP  glipiZIDE (GLUCOTROL) 5 MG tablet Take 1 tablet (5 mg total) by mouth daily before breakfast. 05/21/16   Stephen Dew, FNP  glucose blood (TRUE METRIX BLOOD GLUCOSE TEST) test strip Use as instructed 05/21/16   Stephen Dew, FNP  guaiFENesin-dextromethorphan (ROBITUSSIN DM) 100-10 MG/5ML syrup Take 5 mLs by mouth every 4 (four) hours as needed for cough (and congestion). 05/27/16   Clayton Bibles, PA-C  hydrochlorothiazide (HYDRODIURIL) 25 MG tablet Take 25 mg by mouth daily at 2 PM. 12/31/15   Historical Provider, MD  insulin glargine (LANTUS) 100 UNIT/ML injection Inject 0.2 mLs (20 Units total) into the skin at bedtime. 05/21/16   Stephen Dew, FNP  Insulin Syringe-Needle U-100 (INSULIN SYRINGE .3CC/30GX5/16") 30G X 5/16" 0.3 ML MISC 1 each by Does not apply route daily. 05/21/16   Stephen Dew,  FNP  lisinopril (PRINIVIL,ZESTRIL) 10 MG tablet Take 1 tablet (10 mg total) by mouth daily. 05/21/16   Stephen Dew, FNP  metFORMIN (GLUCOPHAGE) 500 MG tablet Take 2 tablets (1,000 mg total) by mouth 2 (two) times daily with a meal. 05/21/16   Stephen Dew, FNP  naproxen (NAPROSYN) 500 MG tablet Take 1 tablet (500 mg total) by mouth 2 (two) times daily. 05/27/16   Clayton Bibles, PA-C    Family History Family History  Problem Relation Age of Onset  . Family history unknown: Yes     Social History Social History  Substance Use Topics  . Smoking status: Current Every Day Smoker    Packs/day: 0.50    Types: Cigarettes  . Smokeless tobacco: Never Used  . Alcohol use Yes     Comment: 3-4 a week      Allergies   Patient has no known allergies.   Review of Systems Review of Systems  Constitutional: Positive for fever (Subjective).  HENT: Positive for congestion and sore throat. Negative for trouble swallowing.   Respiratory: Positive for cough. Negative for shortness of breath.   Cardiovascular: Negative for chest pain and leg swelling.  Gastrointestinal: Positive for vomiting (Post-tussive).  Musculoskeletal: Positive for arthralgias and myalgias (Right hip).  Skin: Negative for color change, rash and wound.  Allergic/Immunologic: Positive for immunocompromised state (diabetes ).  Neurological: Negative for weakness and numbness.  Psychiatric/Behavioral: Negative for self-injury.     Physical Exam Updated Vital Signs BP 141/93 (BP Location: Left Arm)   Pulse 92   Temp 98.5 F (36.9 C) (Oral)   Resp 18   SpO2 100%   Physical Exam  Constitutional: He appears well-developed and well-nourished. No distress.  HENT:  Head: Normocephalic and atraumatic.  Mouth/Throat: Uvula is midline and mucous membranes are normal. Mucous membranes are not dry. No trismus in the jaw. Posterior oropharyngeal erythema present. No oropharyngeal exudate, posterior oropharyngeal edema or tonsillar abscesses.  Eyes: Conjunctivae and EOM are normal. Right eye exhibits no discharge. Left eye exhibits no discharge.  Neck: Normal range of motion. Neck supple.  Cardiovascular: Normal rate and regular rhythm.   Pulmonary/Chest: Effort normal and breath sounds normal. No stridor. No respiratory distress. He has no wheezes. He has no rales.  Musculoskeletal:  Right hip with TTP over lateral hip. No erythema, edema, or warmth.No other bony TTP throughout back and hip. Bears weight  equally on BLE without difficulty.    Lymphadenopathy:    He has no cervical adenopathy.  Neurological: He is alert.  Skin: He is not diaphoretic.  Nursing note and vitals reviewed.    ED Treatments / Results  DIAGNOSTIC STUDIES: Oxygen Saturation is 100% on RA, normal by my interpretation.    COORDINATION OF CARE: 11:59 AM- Pt advised of plan for treatment and pt agrees.  Labs (all labs ordered are listed, but only abnormal results are displayed) Labs Reviewed - No data to display  EKG  EKG Interpretation None       Radiology Dg Chest 2 View  Result Date: 05/27/2016 CLINICAL DATA:  right hip pain, fall 3 days ago, cough and fever EXAM: CHEST  2 VIEW COMPARISON:  05/27/2016 FINDINGS: Cardiomediastinal silhouette is stable. Scarring in left midlung is stable. No infiltrate or pleural effusion. No pulmonary edema. Mild degenerative changes mid thoracic spine. IMPRESSION: No active cardiopulmonary disease.  Stable scarring in left midlung. Electronically Signed   By: Lahoma Crocker M.D.   On: 05/27/2016 12:42   Dg  Hip Unilat W Or Wo Pelvis 2-3 Views Right  Result Date: 05/27/2016 CLINICAL DATA:  Hip pain.  Fall 3 days ago . EXAM: DG HIP (WITH OR WITHOUT PELVIS) 2-3V RIGHT COMPARISON:  No recent prior. FINDINGS: No acute bony joint abnormality identified. No evidence of fracture or dislocation. Ill-defined faint sclerotic density right ilium. Punctate sclerotic focus is also noted in the right ischium. Although these could represent bone islands whole-body bone scan with attention to the pelvis suggested to further evaluate to exclude active blastic lesion(s). IMPRESSION: 1.  No acute abnormality. 2. Faint ill-defined sclerotic density right ilium. Small sclerotic density right ischium. Although these could represent bone islands whole-body bone scan with attention to the pelvis suggested to further evaluate to exclude active blastic lesions. Electronically Signed   By: Marcello Moores  Register    On: 05/27/2016 12:43    Procedures Procedures (including critical care time)  Medications Ordered in ED Medications  albuterol (PROVENTIL HFA;VENTOLIN HFA) 108 (90 Base) MCG/ACT inhaler 2 puff (2 puffs Inhalation Given 05/27/16 1211)  aerochamber Z-Stat Plus/medium 1 each (1 each Other Given 05/27/16 1213)     Initial Impression / Assessment and Plan / ED Course  I have reviewed the triage vital signs and the nursing notes.  Pertinent labs & imaging results that were available during my care of the patient were reviewed by me and considered in my medical decision making (see chart for details).  Clinical Course    Afebrile, nontoxic patient with constellation of symptoms consistent with viral respiratory infection.  CXR negative.  Blood glucose currently (recently) well controlled.  Mechanical fall resulting in right hip pain likely mild contusion.  Xray demonstrated two areas of bony abnormality that will need further study - pt advised and shown print out of xray, advised to call PCP today to establish close follow up.   D/C home with symptomatic medications, PCP follow up.   Discussed result, findings, treatment, and follow up  with patient.  Pt given return precautions.  Pt verbalizes understanding and agrees with plan.       Final Clinical Impressions(s) / ED Diagnoses   Final diagnoses:  Viral respiratory illness  Right hip pain  Abnormal x-ray of pelvis    New Prescriptions Discharge Medication List as of 05/27/2016 12:56 PM    START taking these medications   Details  benzonatate (TESSALON) 100 MG capsule Take 1 capsule (100 mg total) by mouth at bedtime as needed for cough., Starting Tue 05/27/2016, Print    guaiFENesin-dextromethorphan (ROBITUSSIN DM) 100-10 MG/5ML syrup Take 5 mLs by mouth every 4 (four) hours as needed for cough (and congestion)., Starting Tue 05/27/2016, Print    naproxen (NAPROSYN) 500 MG tablet Take 1 tablet (500 mg total) by mouth 2 (two) times  daily., Starting Tue 05/27/2016, Print       I personally performed the services described in this documentation, which was scribed in my presence. The recorded information has been reviewed and is accurate.     Clayton Bibles, PA-C 05/27/16 1449    Julianne Rice, MD 05/28/16 [phone]

## 2016-05-27 NOTE — Discharge Instructions (Signed)
Read the information below.  Use the prescribed medication as directed.  Please discuss all new medications with your pharmacist.  You may return to the Emergency Department at any time for worsening condition or any new symptoms that concern you.  If you develop high fevers that do not resolve with tylenol or ibuprofen, you have difficulty swallowing or breathing, or you are unable to tolerate fluids by mouth, return to the ER for a recheck.    °

## 2016-05-28 MED FILL — BENZONATATE 100 MG CAPSULE: 100 | 15 days supply | Qty: 15 | Fill #0

## 2016-05-28 MED FILL — NAPROXEN 500 MG TABLET: 500 | 15 days supply | Qty: 30 | Fill #0

## 2016-06-03 NOTE — Congregational Nurse Program (Signed)
Congregational Nurse Program Note  Date of Encounter: 05/28/2016  Past Medical History: Past Medical History:  Diagnosis Date  . Diabetes mellitus without complication (HCC)   . Hypertension     Encounter Details:     CNP Questionnaire - 05/28/16 1254      Patient Demographics   Is this a new or existing patient? New   Patient is considered a/an Not Applicable   Race African-American/Black     Patient Assistance   Location of Patient Assistance Not Applicable   Patient's financial/insurance status Low Income;Self-Pay (Uninsured)   Uninsured Patient ([place] Card/Care Connects) Yes   Interventions Counseled to make appt. with provider   Patient referred to apply for the following financial assistance [facility]   Food insecurities addressed Not Applicable   Transportation assistance No   Assistance securing medications Yes   Type of Patent attorneyAssistance [facility]   Educational health offerings Navigating the healthcare system     Encounter Details   Primary purpose of visit Chronic Illness/Condition Visit   Was an Emergency Department visit averted? Not Applicable   Does patient have a medical provider? No   Patient referred to Clinic   Was a mental health screening completed? (GAINS tool) No   Does patient have dental issues? No   Does patient have vision issues? No   Does your patient have an abnormal blood pressure today? No   Since previous encounter, have you referred patient for abnormal blood pressure that resulted in a new diagnosis or medication change? No   Does your patient have an abnormal blood glucose today? No   Since previous encounter, have you referred patient for abnormal blood glucose that resulted in a new diagnosis or medication change? No   Was there a life-saving intervention made? No         Amb Nursing Assessment - 05/21/16 0956      Pain Assessment   Pain Score 6    Pain Type Acute pain   Pain Location Back   Pain  Orientation Right   Pain Descriptors / Indicators Aching   Pain Onset 1 to 4 weeks ago   Pain Frequency Intermittent     Nutrition Screen   Diabetes Yes     Functional Status   Activities of Daily Living Independent   Ambulation Independent   Medication Administration Independent   Home Management Independent     Abuse/Neglect Assessment   Do you feel unsafe in your current relationship? No   Do you feel physically threatened by others? No   Anyone hurting you at home, work, or school? No   Unable to ask? No     Patient Literacy   How often do you need to have someone help you when you read instructions, pamphlets, or other written materials from your doctor or pharmacy? 1 - Never     Web designerLanguage Assistant   Interpreter Needed? No     Had prescriptions from ED visit for bronchitis.  States is diabetic (non-medicated).  Needs ongoing integrative care.  Referred [facility]avin[facility] Ann [facility] for [facility] up

## 2016-06-05 ENCOUNTER — Telehealth: Payer: Self-pay

## 2016-06-05 ENCOUNTER — Ambulatory Visit (INDEPENDENT_AMBULATORY_CARE_PROVIDER_SITE_OTHER): Payer: Worker's Compensation | Admitting: Family Medicine

## 2016-06-05 ENCOUNTER — Ambulatory Visit (INDEPENDENT_AMBULATORY_CARE_PROVIDER_SITE_OTHER): Payer: Worker's Compensation

## 2016-06-05 VITALS — BP 152/94 | HR 83 | Temp 98.1°F | Resp 16 | Ht 71.0 in | Wt 158.0 lb

## 2016-06-05 DIAGNOSIS — M25551 Pain in right hip: Secondary | ICD-10-CM

## 2016-06-05 DIAGNOSIS — S7001XA Contusion of right hip, initial encounter: Secondary | ICD-10-CM

## 2016-06-05 DIAGNOSIS — Y990 Civilian activity done for income or pay: Secondary | ICD-10-CM

## 2016-06-05 DIAGNOSIS — M545 Low back pain, unspecified: Secondary | ICD-10-CM

## 2016-06-05 MED ORDER — METHOCARBAMOL 500 MG PO TABS: 500.0000 mg | ORAL_TABLET | Freq: Four times a day (QID) | ORAL | 0 refills | Status: DC

## 2016-06-05 NOTE — Progress Notes (Signed)
Subjective:     Patient ID: Stephen Bird, male   DOB: 09/07/1960, 56 y.o.   MRN: [id number]  HPI This 56 y.o. male presents for evaluation of R hip pain and R leg pain; fell at work on 05/24/16 at 11:30pm.  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  Coworker took boxes to trash; knocked on door to reenter; pt slipped on grease on walkway.  Landed on R hip.  The guy behind pt, picked up pt.  Tried to lay on ground for a bit due to pain.  Coworker set pt on chair.  Sitting down after fall.  Manager completed accident report.  Went home after injury; was limping; coworker drove pt home. Then had the next day off.  Pain worsened.  Went to emergency room at [facility] on 05/27/16; s/p hip xray.  No fracture identified.  R hip xray revealed small sclerotic density R ischium; recommended bone scan; has notified PCP to order bone scan.   No medications prescribed in ED for pain.  Injury occurred greater than ten days ago.  Pain mostly in R lateral hip and also in lower back.  Still limping; using a cane; normally does not use a cane.  R hip radiating into groin region.  No n/t/w.  Sharp pains into R lower back and radiates to L side of back.  No medication other than Aleve as needed.  Ibuprofen 800mg  moderately well.  Has been out of work since date of injury.  Has two jobs; [employer].  Prescribed Naproxen by ED PA-C.   Review of Systems  Constitutional: Negative for chills, diaphoresis and fatigue.  Musculoskeletal: Positive for arthralgias, gait problem and myalgias. Negative for joint swelling.       Objective:   Physical Exam  Constitutional: He is oriented to person, place, and time. He appears well-developed and well-nourished. No distress.  HENT:  Head: Normocephalic and atraumatic.  Eyes: Conjunctivae and EOM are normal. Pupils are equal, round, and reactive to light.  Neck: Normal range of motion. Neck  supple. Carotid bruit is not present. No thyromegaly present.  Cardiovascular: Normal rate, regular rhythm, normal heart sounds and intact distal pulses.  Exam reveals no gallop and no friction rub.   No murmur heard. Pulmonary/Chest: Effort normal and breath sounds normal. He has no wheezes. He has no rales.  Musculoskeletal:       Right hip: He exhibits tenderness and bony tenderness. He exhibits normal range of motion, normal strength and no swelling.       Lumbar back: He exhibits decreased range of motion, tenderness, bony tenderness and pain. He exhibits no spasm and normal pulse.  Lumbar spine: decreased ROM of lumbar spine; straight leg raises equivocal; motor 5/5 RLE.  Toe and heel walking intact but slowed due to pain. R HIP: full ROM of R hip yet pain reproduced; +TTP lateral aspect of R hip.  Lymphadenopathy:    He has no cervical adenopathy.  Neurological: He is alert and oriented to person, place, and time. No cranial nerve deficit.  Skin: Skin is warm and dry. No rash noted. He is not diaphoretic.  Psychiatric: He has a normal mood and affect. His behavior is normal.  Nursing note and vitals reviewed.  Dg Lumbar Spine Complete  Result Date: 06/05/2016 CLINICAL DATA:  Initial evaluation for acute right hip and right lower back pain status post  recent fall. EXAM: LUMBAR SPINE - COMPLETE 4+ VIEW COMPARISON:  None. FINDINGS: 5 non rib-bearing lumbar type vertebral bodies are present. Vertebral bodies normally aligned with preservation of the normal lumbar lordosis paravertebral body heights preserved. No acute fracture or subluxation. Mild degenerative intervertebral disc space narrowing at L5-S1. Bilateral facet arthrosis at L5-S1. Aorto bi-iliac atherosclerosis noted. IMPRESSION: 1. No radiographic evidence for acute abnormality within the lumbar spine. 2. 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 3. Aorto bi-iliac atherosclerosis. Electronically Signed   By:  Rise MuBenjamin  McClintock M.D.   On: 06/05/2016 14:04   Dg Hip Unilat W Or W/o Pelvis 2-3 Views Right  Result Date: 06/05/2016 CLINICAL DATA:  Initial evaluation for acute right hip and lower back pain status post recent fall. EXAM: DG HIP (WITH OR WITHOUT PELVIS) 2-3V RIGHT COMPARISON:  None. FINDINGS: No acute fracture or dislocation. Femoral heads are normal alignment within the acetabula. Femoral head heights preserved. Mild degenerative osteoarthritic joint space narrowing present about the hips bilaterally. Bony pelvis intact. SI joints approximated. No soft tissue abnormality. The IMPRESSION: 1. No acute osseous abnormality about the right hip. 2. 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 Electronically Signed   By: Rise MuBenjamin  McClintock M.D.   On: 06/05/2016 14:06      Assessment:     1. Right hip pain   2. Contusion of right hip, initial encounter   3. Acute right-sided low back pain without sciatica   4. Work related injury       -New. -ED records reviewed in detail.   -obtained lumbar spine films and R hip films today with no acute process. -rx for Robaxin provided for muscle spasms.   -recommend LIGHT DUTY for patient for the next week; pt is to avoid prolonged standing or walking; pt should not lift more than 10 pounds.  Recommend frequent ambulation to avoid stiffness. -refer to orthopedics due to persistent severity of pain.   Plan:     Orders Placed This Encounter  Procedures  . DG Lumbar Spine Complete  . DG HIP UNILAT W OR W/O PELVIS 2-3 VIEWS RIGHT  . Ambulatory referral to Orthopedic Surgery    Meds ordered this encounter  Medications  . methocarbamol (ROBAXIN) 500 MG tablet    Sig: Take 1 tablet (500 mg total) by mouth 4 (four) times daily.    Dispense:  45 tablet    Refill:  0   Cecilie LowersKristi Martin Leeandre Nordling, M.D. [facility]   [address] [place], [place]  [id number] [phone] phone [phone] fax

## 2016-06-05 NOTE — Telephone Encounter (Signed)
Stephen Bird, workers comp Financial risk analystnurse case manager needs to speak with provider about medications prescribed for pt/ [phone] Fax: [phone]

## 2016-06-05 NOTE — Telephone Encounter (Signed)
This was discussed with Sydni.  From my understanding, caseworker for work needed an understanding of the med patient received.  Please advise via direct phone contact, that the robaxin was given to address possible muscle spasm and strain after fall.  This is a muscle relaxant.

## 2016-06-05 NOTE — Patient Instructions (Signed)
     IF you received an x-ray today, you will receive an invoice from [facility]. Please contact [facility] at [phone] with questions or concerns regarding your invoice.   IF you received labwork today, you will receive an invoice from [facility]. Please contact [facility] at [phone] with questions or concerns regarding your invoice.   Our billing staff will not be able to assist you with questions regarding bills from these companies.  You will be contacted with the lab results as soon as they are available. The fastest way to get your results is to activate your My Chart account. Instructions are located on the last page of this paperwork. If you have not heard from us regarding the results in 2 weeks, please contact this office.     

## 2016-06-09 ENCOUNTER — Encounter: Payer: Self-pay | Admitting: Pediatric Intensive Care

## 2016-06-09 NOTE — Telephone Encounter (Signed)
Lm with md note for tammy

## 2016-06-12 ENCOUNTER — Ambulatory Visit (INDEPENDENT_AMBULATORY_CARE_PROVIDER_SITE_OTHER): Payer: Worker's Compensation | Admitting: Family Medicine

## 2016-06-12 VITALS — BP 156/98 | HR 91 | Temp 98.5°F | Resp 18 | Ht 71.0 in | Wt 154.0 lb

## 2016-06-12 DIAGNOSIS — Y990 Civilian activity done for income or pay: Secondary | ICD-10-CM

## 2016-06-12 DIAGNOSIS — S7001XD Contusion of right hip, subsequent encounter: Secondary | ICD-10-CM

## 2016-06-12 DIAGNOSIS — S3992XD Unspecified injury of lower back, subsequent encounter: Secondary | ICD-10-CM

## 2016-06-12 NOTE — Patient Instructions (Addendum)
   IF you received an x-ray today, you will receive an invoice from [facility]. Please contact [facility] at [phone] with questions or concerns regarding your invoice.   IF you received labwork today, you will receive an invoice from [facility]. Please contact [facility] at [phone] with questions or concerns regarding your invoice.   Our billing staff will not be able to assist you with questions regarding bills from these companies.  You will be contacted with the lab results as soon as they are available. The fastest way to get your results is to activate your My Chart account. Instructions are located on the last page of this paperwork. If you have not heard from us regarding the results in 2 weeks, please contact this office.     Low Back Sprain Rehab Ask your health care provider which exercises are safe for you. Do exercises exactly as told by your health care provider and adjust them as directed. It is normal to feel mild stretching, pulling, tightness, or discomfort as you do these exercises, but you should stop right away if you feel sudden pain or your pain gets worse. Do not begin these exercises until told by your health care provider. Stretching and range of motion exercises These exercises warm up your muscles and joints and improve the movement and flexibility of your back. These exercises also help to relieve pain, numbness, and tingling. Exercise A: Lumbar rotation   1. Lie on your back on a firm surface and bend your knees. 2. Straighten your arms out to your sides so each arm forms an "L" shape with a side of your body (a 90 degree angle). 3. Slowly move both of your knees to one side of your body until you feel a stretch in your lower back. Try not to let your shoulders move off of the floor. 4. Hold for __________ seconds. 5. Tense your abdominal muscles and slowly move your knees back to the starting position. 6. Repeat this exercise on the  other side of your body. Repeat __________ times. Complete this exercise __________ times a day. Exercise B: Prone extension on elbows   1. Lie on your abdomen on a firm surface. 2. Prop yourself up on your elbows. 3. Use your arms to help lift your chest up until you feel a gentle stretch in your abdomen and your lower back.  This will place some of your body weight on your elbows. If this is uncomfortable, try stacking pillows under your chest.  Your hips should stay down, against the surface that you are lying on. Keep your hip and back muscles relaxed. 4. Hold for __________ seconds. 5. Slowly relax your upper body and return to the starting position. Repeat __________ times. Complete this exercise __________ times a day. Strengthening exercises These exercises build strength and endurance in your back. Endurance is the ability to use your muscles for a long time, even after they get tired. Exercise C: Pelvic tilt  1. Lie on your back on a firm surface. Bend your knees and keep your feet flat. 2. Tense your abdominal muscles. Tip your pelvis up toward the ceiling and flatten your lower back into the floor.  To help with this exercise, you may place a small towel under your lower back and try to push your back into the towel. 3. Hold for __________ seconds. 4. Let your muscles relax completely before you repeat this exercise. Repeat __________ times. Complete this exercise __________ times a day. Exercise   D: Alternating arm and leg raises   1. Get on your hands and knees on a firm surface. If you are on a hard floor, you may want to use padding to cushion your knees, such as an exercise mat. 2. Line up your arms and legs. Your hands should be below your shoulders, and your knees should be below your hips. 3. Lift your left leg behind you. At the same time, raise your right arm and straighten it in front of you.  Do not lift your leg higher than your hip.  Do not lift your arm  higher than your shoulder.  Keep your abdominal and back muscles tight.  Keep your hips facing the ground.  Do not arch your back.  Keep your balance carefully, and do not hold your breath. 4. Hold for __________ seconds. 5. Slowly return to the starting position and repeat with your right leg and your left arm. Repeat __________ times. Complete this exercise __________ times a day. Exercise E: Abdominal set with straight leg raise   1. Lie on your back on a firm surface. 2. Bend one of your knees and keep your other leg straight. 3. Tense your abdominal muscles and lift your straight leg up, 4-6 inches (10-15 cm) off the ground. 4. Keep your abdominal muscles tight and hold for __________ seconds.  Do not hold your breath.  Do not arch your back. Keep it flat against the ground. 5. Keep your abdominal muscles tense as you slowly lower your leg back to the starting position. 6. Repeat with your other leg. Repeat __________ times. Complete this exercise __________ times a day. Posture and body mechanics   Body mechanics refers to the movements and positions of your body while you do your daily activities. Posture is part of body mechanics. Good posture and healthy body mechanics can help to relieve stress in your body's tissues and joints. Good posture means that your spine is in its natural S-curve position (your spine is neutral), your shoulders are pulled back slightly, and your head is not tipped forward. The following are general guidelines for applying improved posture and body mechanics to your everyday activities. Standing    When standing, keep your spine neutral and your feet about hip-width apart. Keep a slight bend in your knees. Your ears, shoulders, and hips should line up.  When you do a task in which you stand in one place for a long time, place one foot up on a stable object that is 2-4 inches (5-10 cm) high, such as a footstool. This helps keep your spine  neutral. Sitting    When sitting, keep your spine neutral and keep your feet flat on the floor. Use a footrest, if necessary, and keep your thighs parallel to the floor. Avoid rounding your shoulders, and avoid tilting your head forward.  When working at a desk or a computer, keep your desk at a height where your hands are slightly lower than your elbows. Slide your chair under your desk so you are close enough to maintain good posture.  When working at a computer, place your monitor at a height where you are looking straight ahead and you do not have to tilt your head forward or downward to look at the screen. Resting    When lying down and resting, avoid positions that are most painful for you.  If you have pain with activities such as sitting, bending, stooping, or squatting (flexion-based activities), lie in a position in which   your body does not bend very much. For example, avoid curling up on your side with your arms and knees near your chest (fetal position).  If you have pain with activities such as standing for a long time or reaching with your arms (extension-based activities), lie with your spine in a neutral position and bend your knees slightly. Try the following positions:  Lying on your side with a pillow between your knees.  Lying on your back with a pillow under your knees. Lifting    When lifting objects, keep your feet at least shoulder-width apart and tighten your abdominal muscles.  Bend your knees and hips and keep your spine neutral. It is important to lift using the strength of your legs, not your back. Do not lock your knees straight out.  Always ask for help to lift heavy or awkward objects. This information is not intended to replace advice given to you by your health care provider. Make sure you discuss any questions you have with your health care provider. Document Released: 05/19/2005 Document Revised: 01/24/2016 Document Reviewed: 02/28/2015 Elsevier  Interactive Patient Education  2017 Elsevier Inc.  

## 2016-06-12 NOTE — Progress Notes (Signed)
Subjective:     Patient ID: Stephen EvertsEric Bird, male   DOB: Jan 24, 1961, 56 y.o.   MRN: [id number]  HPI This 56 y.o. male presents for one week follow-up of work related injury that occurred on 05/24/16.  S/p visit last week that included R hip xray and lumbar spine xrays.  Rx for Robaxin provided; recommended rest, increase in ambulation, and light duty.  Also referred to ortho for further evaluation due to persistent pain that was actually worsening and physical therapy. Severity 8/10; taking Robaxin without improvement. Returned to work for one day; was doing same job.  Had patient washing dishes and leaning over which caused pt to hurt in hip and lower back more.  No place to sit at current work place.  Strictly a stand up job; thus, no sit down positions.  Works third shift; works 8:00pm to 5:00am.  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  Pain is mostly lateral hip and also B back region; can feel the back portion of leg; will get a burning pain.  When goes to stand up, the pain is sharp and radiates intermittently down back R and up into upper back.    Has an appointment with 06/20/16 with orthopedics [facility] at 2:00pm.   Review of Systems  Constitutional: Negative for chills, diaphoresis, fatigue and fever.  Musculoskeletal: Positive for arthralgias.       Objective:   Physical Exam  Constitutional: He is oriented to person, place, and time. He appears well-developed and well-nourished. No distress.  HENT:  Head: Normocephalic and atraumatic.  Eyes: Conjunctivae and EOM are normal. Pupils are equal, round, and reactive to light.  Neck: Normal range of motion. Neck supple. Carotid bruit is not present. No thyromegaly present.  Cardiovascular: Normal rate, regular rhythm, normal heart sounds and intact distal pulses.  Exam reveals no gallop and no friction rub.   No murmur heard. Pulmonary/Chest: Effort normal and breath sounds normal. He has no wheezes.  He has no rales.  Musculoskeletal:       Right hip: He exhibits tenderness. He exhibits normal range of motion and normal strength.       Lumbar back: He exhibits decreased range of motion and pain. He exhibits no tenderness, no bony tenderness and normal pulse.  Lumbar spine: full range of motion in all directions yet pain reproduced in all directions.  Straight leg raises equivocal.  Motor 5/5.   R hip: +TTP lateral hip; full range of motion without limitation; pain reproduced with all range of motion; pain 5/5.  Patient very slow going from sitting to standing due to pain; ambulates with cane for support.   Lymphadenopathy:    He has no cervical adenopathy.  Neurological: He is alert and oriented to person, place, and time. No cranial nerve deficit.  Skin: Skin is warm and dry. No rash noted. He is not diaphoretic.  Psychiatric: He has a normal mood and affect. His behavior is normal.  Nursing note and vitals reviewed.  Tammy/ Workman's Compensation contact:     Assessment:     1. Contusion of right hip, subsequent encounter   2. Lower back injury, subsequent encounter   3. Work related injury        Plan:     -persistent symptoms of R hip pain and lower back pain. -appointment on 06/20/16 with orthopedics for further evaluation. -continue with light duty; avoid prolonged sitting or standing. -recommend daily stretching and frequent ambulation. -no follow-up  warranted due to upcoming appointment with orthopedics.    Chalee Hirota Paulita Fujita, M.D. [facility] [address] [place], [place]  [phone] phone [phone] fax

## 2016-06-20 ENCOUNTER — Ambulatory Visit (INDEPENDENT_AMBULATORY_CARE_PROVIDER_SITE_OTHER): Payer: Self-pay | Admitting: Orthopaedic Surgery

## 2016-07-02 ENCOUNTER — Encounter: Payer: Self-pay | Admitting: Family Medicine

## 2016-07-02 ENCOUNTER — Ambulatory Visit (INDEPENDENT_AMBULATORY_CARE_PROVIDER_SITE_OTHER): Payer: Self-pay | Admitting: Family Medicine

## 2016-07-02 VITALS — BP 142/92 | HR 98 | Temp 98.1°F | Resp 18 | Ht 71.0 in | Wt 149.4 lb

## 2016-07-02 DIAGNOSIS — F17200 Nicotine dependence, unspecified, uncomplicated: Secondary | ICD-10-CM

## 2016-07-02 DIAGNOSIS — I10 Essential (primary) hypertension: Secondary | ICD-10-CM

## 2016-07-02 DIAGNOSIS — M164 Bilateral post-traumatic osteoarthritis of hip: Secondary | ICD-10-CM

## 2016-07-02 DIAGNOSIS — F32A Depression, unspecified: Secondary | ICD-10-CM

## 2016-07-02 DIAGNOSIS — M160 Bilateral primary osteoarthritis of hip: Secondary | ICD-10-CM | POA: Insufficient documentation

## 2016-07-02 DIAGNOSIS — F329 Major depressive disorder, single episode, unspecified: Secondary | ICD-10-CM

## 2016-07-02 DIAGNOSIS — E1122 Type 2 diabetes mellitus with diabetic chronic kidney disease: Secondary | ICD-10-CM

## 2016-07-02 LAB — POCT URINALYSIS DIP (DEVICE)
BILIRUBIN URINE: NEGATIVE
Glucose, UA: 500 mg/dL — AB
Hgb urine dipstick: NEGATIVE
Ketones, ur: NEGATIVE mg/dL
Leukocytes, UA: NEGATIVE
NITRITE: NEGATIVE
Protein, ur: NEGATIVE mg/dL
SPECIFIC GRAVITY, URINE: 1.015 (ref 1.005–1.030)
Urobilinogen, UA: 0.2 mg/dL (ref 0.0–1.0)
pH: 5 (ref 5.0–8.0)

## 2016-07-02 LAB — GLUCOSE, CAPILLARY: Glucose-Capillary: 174 mg/dL — ABNORMAL HIGH (ref 65–99)

## 2016-07-02 LAB — POCT GLYCOSYLATED HEMOGLOBIN (HGB A1C): Hemoglobin A1C: 11.2

## 2016-07-02 MED ORDER — INSULIN GLARGINE 100 UNIT/ML ~~LOC~~ SOLN
20.0000 [IU] | Freq: Every day | SUBCUTANEOUS | 11 refills | Status: DC
Start: 2016-07-02 — End: 2016-09-30

## 2016-07-02 MED FILL — !LANTUS 100 UNITS/ML VIAL: 100 | 28 days supply | Qty: 10 | Fill #0

## 2016-07-02 NOTE — Progress Notes (Signed)
Subjective:    Patient ID: Stephen EvertsEric Bird, male    DOB: 07-22-1960, 56 y.o.   MRN: [id number]  Hypertension  This is a recurrent problem. The current episode started more than 1 year ago. The problem has been gradually improving since onset. The problem is uncontrolled. Pertinent negatives include no anxiety, headaches, malaise/fatigue, neck pain, orthopnea, palpitations, peripheral edema, PND, shortness of breath or sweats. There are no associated agents to hypertension. Risk factors for coronary artery disease include diabetes mellitus, dyslipidemia and obesity. Past treatments include calcium channel blockers. The current treatment provides mild improvement. Compliance problems include diet and medication cost.  There is no history of angina, kidney disease, CAD/MI, CVA, heart failure, left ventricular hypertrophy, PVD, renovascular disease or retinopathy. There is no history of chronic renal disease, coarctation of the aorta, hyperaldosteronism, hypercortisolism, hyperparathyroidism, a hypertension causing med, pheochromocytoma, sleep apnea or a thyroid problem.  Diabetes  He presents for his follow-up diabetic visit. He has type 2 diabetes mellitus. His disease course has been fluctuating (Stephen Bird says that he has not taken Lantus in greater than 1 week. ). Hypoglycemia symptoms include nervousness/anxiousness. Pertinent negatives for hypoglycemia include no dizziness, headaches or sweats. Associated symptoms include fatigue and weakness. Pertinent negatives for diabetes include no polydipsia, no polyphagia and no polyuria. Pertinent negatives for hypoglycemia complications include no hospitalization. Pertinent negatives for diabetic complications include no CVA, PVD or retinopathy. Risk factors for coronary artery disease include dyslipidemia and hypertension. Current diabetic treatment includes insulin injections and oral agent (monotherapy). He is compliant with treatment some of the time.  Back Pain   This is a new problem. The current episode started 1 to 4 weeks ago (Patient sustained a back injury on 06/05/2016. He had a fall at his work [employer] The problem has been gradually worsening since onset. The pain is present in the lumbar spine. The pain is at a severity of 7/10. The pain is the same all the time. The symptoms are aggravated by lying down, position, sitting, stress and coughing. Associated symptoms include numbness and weakness. Pertinent negatives include no fever or headaches.  medications.   Past Medical History:  Diagnosis Date  . Diabetes mellitus without complication (HCC)   . Hypertension    Social History   Social History Narrative  . No narrative on file   Immunization History  Administered Date(s) Administered  . Influenza Split 04/11/2015  . Pneumococcal Polysaccharide-23 06/09/2015  No Known Allergies   Review of Systems  Constitutional: Positive for fatigue and unexpected weight change (weight loss). Negative for fever and malaise/fatigue.  Eyes: Positive for photophobia.  Respiratory: Negative.  Negative for shortness of breath.   Cardiovascular: Negative.  Negative for palpitations, orthopnea and PND.  Gastrointestinal: Negative.   Endocrine: Negative for polydipsia, polyphagia and polyuria.  Genitourinary: Negative.   Musculoskeletal: Positive for back pain and myalgias. Negative for neck pain.  Skin: Negative.   Allergic/Immunologic: Negative.  Negative for immunocompromised state.  Neurological: Positive for weakness and numbness. Negative for dizziness and headaches.  Hematological: Negative.   Psychiatric/Behavioral: Positive for decreased concentration and depression. Negative for suicidal ideas. The patient is nervous/anxious.        Depression       Objective:   Physical Exam  Constitutional: He is oriented to person, place, and time. He appears well-developed and well-nourished.  HENT:  Head: Normocephalic and atraumatic.  Right  Ear: External ear normal.  Left Ear: External ear normal.  Mouth/Throat: Oropharynx is clear and  moist.  Eyes: Conjunctivae are normal. Pupils are equal, round, and reactive to light.  Neck: Normal range of motion. Neck supple.  Cardiovascular: Normal rate, regular rhythm, normal heart sounds and intact distal pulses.   Pulmonary/Chest: Effort normal and breath sounds normal.  Abdominal: Soft. Bowel sounds are normal.  Musculoskeletal:       Lumbar back: He exhibits decreased range of motion, pain and spasm.  Ambulating with a cane  Neurological: He is alert and oriented to person, place, and time. He has normal strength and normal reflexes. He displays a negative Romberg sign.  Monofilament exam normal  Skin: Skin is warm and dry.  Psychiatric: He exhibits a depressed mood. He expresses no homicidal and no suicidal ideation.         BP (!) 140/93 (BP Location: Right Arm, Patient Position: Sitting, Cuff Size: Large)   Pulse 98   Temp 98.1 F (36.7 C) (Oral)   Resp 18   Ht 5\' 11"  (1.803 m)   Wt 149 lb 6.4 oz (67.8 kg)   SpO2 100%   BMI 20.84 kg/m  Assessment & Plan:   1. Type 2 diabetes mellitus with chronic kidney disease, without long-term current use of insulin, unspecified CKD stage (HCC) Hemoglobin a1C has improved from 14.7 to 11.1 on current medication regimen. Will continue  Lantus at 20 units HS. Patient to check blood sugar fasting in am and post prandial. Recommend a lowfat, low carbohydrate diet divided over 5-6 small meals, increase water intake to 6-8 glasses, and 150 minutes per week of cardiovascular exercise.   - POCT glycosylated hemoglobin (Hb A1C) - Glucose (CBG) - insulin glargine (LANTUS) 100 UNIT/ML injection; Inject 0.2 mLs (20 Units total) into the skin at bedtime.  Dispense: 10 mL; Refill: 11  2. Essential hypertension Blood pressure is at goal on current medication regimen.   3. Post-traumatic osteoarthritis of both hips Patient is under the care  of orthopedic specialist, I will defer for continued treatment and evaluation.  - Vitamin D, 25-hydroxy  4. Depression Depression has improved minimally on current medication regimen. Patient did not follow up with [facility].   5. Tobacco dependence Smoking cessation instruction/counseling given:  counseled patient on the dangers of tobacco use, advised patient to stop smoking, and reviewed strategies to maximize success  RTC: 3 months for DMII, hypertension, depression  Larin Weissberg M, FNP    The patient was given clear instructions to go to ER or return to medical center if symptoms do not improve, worsen or new problems develop. The patient verbalized understanding. Will notify patient with laboratory results.

## 2016-07-03 ENCOUNTER — Other Ambulatory Visit: Payer: Self-pay

## 2016-07-03 ENCOUNTER — Other Ambulatory Visit: Payer: Self-pay | Admitting: Family Medicine

## 2016-07-03 DIAGNOSIS — E559 Vitamin D deficiency, unspecified: Secondary | ICD-10-CM

## 2016-07-03 DIAGNOSIS — E118 Type 2 diabetes mellitus with unspecified complications: Secondary | ICD-10-CM

## 2016-07-03 LAB — VITAMIN D 25 HYDROXY (VIT D DEFICIENCY, FRACTURES): Vit D, 25-Hydroxy: 19 ng/mL — ABNORMAL LOW (ref 30–100)

## 2016-07-03 MED ORDER — HYDROCHLOROTHIAZIDE 25 MG PO TABS: 25.0000 mg | ORAL_TABLET | Freq: Every day | ORAL | 1 refills | Status: DC

## 2016-07-03 MED ORDER — ERGOCALCIFEROL 1.25 MG (50000 UT) PO CAPS: 50000.0000 [IU] | ORAL_CAPSULE | ORAL | 4 refills | Status: DC

## 2016-07-03 MED ORDER — METFORMIN HCL 500 MG PO TABS
1000.0000 mg | ORAL_TABLET | Freq: Two times a day (BID) | ORAL | 1 refills | Status: DC
Start: 2016-07-03 — End: 2016-09-30

## 2016-07-03 MED FILL — TRUE METRIX TEST STRIP: 25 days supply | Qty: 100 | Fill #1

## 2016-07-03 MED FILL — GABAPENTIN 300 MG CAPSULE: 300 | 30 days supply | Qty: 90 | Fill #1

## 2016-07-03 MED FILL — ?METFORMIN HCL 500MG TABLET: 500 | 30 days supply | Qty: 120 | Fill #0

## 2016-07-03 MED FILL — DULoxetine HCL 20 MG CPEP: 20 | 30 days supply | Qty: 30 | Fill #1

## 2016-07-03 MED FILL — TRUEPLUS SYR 0.3ML 30GX5/16: 30G X 5/16" | 25 days supply | Qty: 100 | Fill #1

## 2016-07-03 MED FILL — LISINOPRIL 10 MG TABLET: 10 | 30 days supply | Qty: 30 | Fill #1

## 2016-07-03 MED FILL — HYDROCHLOROTHIAZIDE 25 MG T: 25 | 30 days supply | Qty: 30 | Fill #0

## 2016-07-03 NOTE — Progress Notes (Signed)
Called and spoke with patient. Advised of vitamin D being low and the need to start weekly vitamin D supplement. Patient verbalized understanding. Thanks!

## 2016-07-03 NOTE — Progress Notes (Signed)
Meds ordered this encounter  Medications  . ergocalciferol (VITAMIN D2) 50000 units capsule    Sig: Take 1 capsule (50,000 Units total) by mouth once a week.    Dispense:  12 capsule    Refill:  4   

## 2016-07-11 ENCOUNTER — Ambulatory Visit (HOSPITAL_COMMUNITY)
Admission: RE | Admit: 2016-07-11 | Discharge: 2016-07-11 | Disposition: A | Discharge status: Home / Self Care | Payer: Self-pay | Attending: Psychiatry | Admitting: Psychiatry

## 2016-07-11 DIAGNOSIS — I10 Essential (primary) hypertension: Secondary | ICD-10-CM | POA: Insufficient documentation

## 2016-07-11 DIAGNOSIS — F17210 Nicotine dependence, cigarettes, uncomplicated: Secondary | ICD-10-CM | POA: Insufficient documentation

## 2016-07-11 DIAGNOSIS — E119 Type 2 diabetes mellitus without complications: Secondary | ICD-10-CM | POA: Insufficient documentation

## 2016-07-11 DIAGNOSIS — F338 Other recurrent depressive disorders: Secondary | ICD-10-CM | POA: Insufficient documentation

## 2016-07-11 NOTE — H&P (Signed)
Behavioral Health Medical Screening Exam  Stephen Bird is an 56 y.o. male.  Total Time spent with patient: 30 minutes  Psychiatric Specialty Exam: Physical Exam  Constitutional: He is oriented to person, place, and time. He appears well-developed and well-nourished.  HENT:  Head: Normocephalic.  Cardiovascular: Normal rate and normal heart sounds.   Respiratory: Effort normal and breath sounds normal.  GI: Soft. Bowel sounds are normal.  Musculoskeletal: Normal range of motion.  Neurological: He is alert and oriented to person, place, and time.  Skin: Skin is warm and dry.    Review of Systems  Psychiatric/Behavioral: Positive for depression. Negative for hallucinations, memory loss, substance abuse and suicidal ideas. The patient is not nervous/anxious and does not have insomnia.   All other systems reviewed and are negative.   Blood pressure 100/66, pulse 100, temperature 99 F (37.2 C), temperature source Oral, resp. rate 16, SpO2 95 %.There is no height or weight on file to calculate BMI.  General Appearance: Casual and Neat  Eye Contact:  Fair  Speech:  Clear and Coherent and Slow  Volume:  Normal  Mood:  Depressed  Affect:  Congruent and Depressed  Thought Process:  Coherent, Goal Directed and Linear  Orientation:  Full (Time, Place, and Person)  Thought Content:  Logical  Suicidal Thoughts:  No  Homicidal Thoughts:  No  Memory:  Immediate;   Good Recent;   Good Remote;   Fair  Judgement:  Good  Insight:  Good  Psychomotor Activity:  Normal  Concentration: Concentration: Good and Attention Span: Good  Recall:  Good  Fund of Knowledge:Good  Language: Good  Akathisia:  No  Handed:  Right  AIMS (if indicated):     Assets:  Communication Skills Desire for Improvement Financial Resources/Insurance Housing Resilience Social Support Transportation Vocational/Educational  Sleep:       Musculoskeletal: Strength & Muscle Tone: within normal limits Gait &  Station: normal Patient leans: N/A  Blood pressure 100/66, pulse 100, temperature 99 F (37.2 C), temperature source Oral, resp. rate 16, SpO2 95 %.  Recommendations:  Based on my evaluation the patient does not appear to have an emergency medical condition.  Laveda AbbeLaurie Britton Parks, NP 07/11/2016, 3:06 PM

## 2016-07-11 NOTE — BH Assessment (Signed)
Assessment Note  Stephen Bird is a 56 y.o. male who presented as a voluntary walk-in to [facility] with complaint of "stress."  Pt said he was transported to [facility] by police because a staff member at his shelter [facility]) recommended that he come to the behavioral health hospital to talk about psychosocial stressors.  Pt reported that he has been feeling depressed and stressed recently because of delays in his worker's comp claim, separation from his wife, and significant pain (Pt suffered a fall that injured his back and leg).  Pt denied feeling suicidal, homicidal, or experiencing auditory/visual hallucinations.  Likewise, Pt denied substance use, although he said he was once treated inpatient for drug use (would not elaborate).  Pt said he hoped to be able to do some therapy today to talk about social stressors.  Author advised of the nature of triage services and that if Pt did not meet inpatient criteria, he would be referred to outpatient.  Before and during assessment, author observed that Pt was drowsy, nodded off, had long pauses in which his eyes were closed, and exhibited mannerisms such as stroking his head.  Pt appeared to be under the influence of a substance.  Pt denied.  Since Pt is a diabetic, staff checked Pt's glucose levels and other vitals.  During assessment, Pt was drowsy and oriented to time, place, situation (he wrote 2017 instead of 2018).  Pt's mood was reported as "stressed," and affect was congruent.  Pt endorsed poor sleep, tearfulness, irritability, and poor concentration.  He denied suicidal or homicidal ideation, self-injury, and substance use.  Pt's motor activity was remarkable in that he exhibited unusual behavior (see above).  Pt endorsed several psychosocial stressors (homelessness, physical pain, separation from wife, legal issues).  Pt's memory and concentration were fair to poor.  Speech was slow but otherwise normal in rate and volume.  Pt's thought processes were slowed.   Thought content was logical and goal-oriented -- Pt was clear he wanted talk therapy.  There was no evidence of delusion.  Pt's insight, judgment, and impulse control were fair to poor.  Pt gave the impression that he hoped for immediate outpatient therapy to discuss ongoing stressors.  Consulted w L Parks who recommended discharge with outpatient resources.  Pt was also urged to visit the ED in case of worsening physical symptoms (pain).  Diagnosis: Major Depressive Disorder, Recurrent, Moderate  Past Medical History:  Past Medical History:  Diagnosis Date  . Diabetes mellitus without complication (HCC)   . Hypertension     Past Surgical History:  Procedure Laterality Date  . BACK SURGERY    . HYDROCELE EXCISION / REPAIR     1998    Family History:  Family History  Problem Relation Age of Onset  . Family history unknown: Yes    Social History:  reports that he has been smoking Cigarettes.  He has been smoking about 0.25 packs per day. He has never used smokeless tobacco. He reports that he drinks alcohol. He reports that he does not use drugs.  Additional Social History:  Alcohol / Drug Use Pain Medications: See PTA Prescriptions: See PTA Over the Counter: See PTA History of alcohol / drug use?: Yes (Pt denied current substance use)  CIWA: CIWA-Ar BP: 100/66 Pulse Rate: 100 COWS:    Allergies: No Known Allergies  Home Medications:  (Not in a hospital admission)  OB/GYN Status:  No LMP for male patient.  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) Can pt return to current living arrangement?: Yes Admission Status: Voluntary Is  patient capable of signing voluntary admission?: Yes Referral Source: Self/Family/Friend Insurance type: SP  Medical Screening Exam ([facility] Walk-in ONLY) Medical Exam completed: Yes  Crisis Care Plan Living Arrangements: Other (Comment) Buyer, retail House -- shelter) Name of Psychiatrist: None currently Name of Therapist: None currently  Education Status Is patient currently in school?: No  Risk to self with the past 6 months Suicidal Ideation: No Has patient been a risk to self within the past 6 months prior to admission? : No Suicidal Intent: No Has patient had any suicidal intent within the past 6 months prior to admission? : No Is patient at risk for suicide?: No Suicidal Plan?: No Has patient had any suicidal plan within the past 6 months prior to admission? : No Access to Means: No What has been your use of drugs/alcohol within the last 12 months?: Alcohol Previous Attempts/Gestures: No Intentional Self Injurious Behavior: None Family Suicide History: No Recent stressful life event(s): Other (Comment), Legal Issues Multimedia programmer Comp concerns; separation from wife; diabetes) Persecutory voices/beliefs?: No Depression: Yes Depression Symptoms: Tearfulness (Poor appetite) Substance abuse history and/or treatment for substance abuse?: Yes Suicide prevention information given to non-admitted patients: Not applicable  Risk to Others within the past 6 months Homicidal Ideation: No Does patient have any lifetime risk of violence toward others beyond the six months prior to admission? : No Thoughts of Harm to Others: No Current Homicidal Intent: No Current Homicidal Plan: No Access to Homicidal Means: No History of harm to others?: No Assessment of Violence: None Noted Does patient have access to weapons?: No Criminal Charges Pending?: No Does patient have a court date: No Is patient on probation?: No  Psychosis Hallucinations: None noted Delusions: None noted  Mental Status  Report Appearance/Hygiene: Other (Comment), Disheveled (Street clothes; ) Eye Contact: Fair Motor Activity: Unsteady, Mannerisms (Rubbing head, nodding head) Speech: Slow Level of Consciousness: Drowsy Mood: Worthless, low self-esteem Affect: Blunted ("Stressed") Anxiety Level: None Thought Processes: Coherent, Relevant Judgement: Partial Orientation: Person, Place, Situation (Thought it was 2017) Obsessive Compulsive Thoughts/Behaviors: None  Cognitive Functioning Concentration: Fair Memory: Remote Intact, Recent Intact IQ: Average Insight: Fair Impulse Control: Fair Appetite: Poor Weight Loss: 3 Sleep: No Change Vegetative Symptoms: None  ADLScreening [facility] Assessment Services) Patient's cognitive ability adequate to safely complete daily activities?: Yes Patient able to express need for assistance with ADLs?: Yes Independently performs ADLs?: Yes (appropriate for developmental age)  Prior Inpatient Therapy Prior Inpatient Therapy: No  Prior Outpatient Therapy Prior Outpatient Therapy: No Does patient have an ACCT team?: No Does patient have Intensive In-House Services?  : No Does patient have [facility] services? : No Does patient have P4CC services?: No  ADL Screening (condition at time of admission) Patient's cognitive ability adequate to safely complete daily activities?: Yes Is the patient deaf or have difficulty hearing?: No Does the patient have difficulty seeing, even when wearing glasses/contacts?: No Does the patient have difficulty concentrating, remembering, or making decisions?: No Patient able to express need for assistance with ADLs?: Yes Does the patient have difficulty dressing or bathing?: No Independently performs ADLs?: Yes (appropriate for developmental age) Does the patient have difficulty walking or climbing stairs?: No Weakness of  Legs: None Weakness of Arms/Hands: None  Home Assistive Devices/Equipment Home Assistive Devices/Equipment:  None  Therapy Consults (therapy consults require a physician order) PT Evaluation Needed: No OT Evalulation Needed: No SLP Evaluation Needed: No Abuse/Neglect Assessment (Assessment to be complete while patient is alone) Physical Abuse: Denies Verbal Abuse: Denies Sexual Abuse: Denies Exploitation of patient/patient's resources: Denies Self-Neglect: Denies Values / Beliefs Cultural Requests During Hospitalization: None Spiritual Requests During Hospitalization: None Consults Spiritual Care Consult Needed: No Social Work Consult Needed: No Merchant navy officerAdvance Directives (For Healthcare) Does Patient Have a Medical Advance Directive?: No    Additional Information 1:1 In Past 12 Months?: No CIRT Risk: No Elopement Risk: No Does patient have medical clearance?: Yes     Disposition:  Disposition Initial Assessment Completed for this Encounter: Yes Disposition of Patient: Outpatient treatment Type of outpatient treatment: Adult (Referred to outpatient resources for stress)  On Site Evaluation by:   Reviewed with Physician:    Dorris FetchEugene T Keiasha Diep 07/11/2016 3:14 PM

## 2016-07-14 LAB — GLUCOSE, CAPILLARY: GLUCOSE-CAPILLARY: 125 mg/dL — AB (ref 65–99)

## 2016-07-17 ENCOUNTER — Encounter (HOSPITAL_COMMUNITY): Payer: Self-pay | Admitting: Emergency Medicine

## 2016-07-17 ENCOUNTER — Emergency Department (HOSPITAL_COMMUNITY): Payer: Self-pay

## 2016-07-17 ENCOUNTER — Emergency Department (HOSPITAL_COMMUNITY)
Admission: EM | Admit: 2016-07-17 | Discharge: 2016-07-17 | Disposition: A | Discharge status: Home / Self Care | Payer: Self-pay | Attending: Emergency Medicine | Admitting: Emergency Medicine

## 2016-07-17 DIAGNOSIS — K5900 Constipation, unspecified: Secondary | ICD-10-CM | POA: Insufficient documentation

## 2016-07-17 DIAGNOSIS — F1123 Opioid dependence with withdrawal: Secondary | ICD-10-CM | POA: Insufficient documentation

## 2016-07-17 DIAGNOSIS — F1110 Opioid abuse, uncomplicated: Secondary | ICD-10-CM

## 2016-07-17 DIAGNOSIS — Z79899 Other long term (current) drug therapy: Secondary | ICD-10-CM | POA: Insufficient documentation

## 2016-07-17 DIAGNOSIS — I10 Essential (primary) hypertension: Secondary | ICD-10-CM | POA: Insufficient documentation

## 2016-07-17 DIAGNOSIS — Z794 Long term (current) use of insulin: Secondary | ICD-10-CM | POA: Insufficient documentation

## 2016-07-17 DIAGNOSIS — F17210 Nicotine dependence, cigarettes, uncomplicated: Secondary | ICD-10-CM | POA: Insufficient documentation

## 2016-07-17 DIAGNOSIS — E119 Type 2 diabetes mellitus without complications: Secondary | ICD-10-CM | POA: Insufficient documentation

## 2016-07-17 LAB — RAPID URINE DRUG SCREEN, HOSP PERFORMED
AMPHETAMINES: NOT DETECTED
BENZODIAZEPINES: NOT DETECTED
Barbiturates: NOT DETECTED
Cocaine: NOT DETECTED
OPIATES: NOT DETECTED
TETRAHYDROCANNABINOL: NOT DETECTED

## 2016-07-17 LAB — COMPREHENSIVE METABOLIC PANEL
ALT: 11 U/L — AB (ref 17–63)
AST: 17 U/L (ref 15–41)
Albumin: 3.9 g/dL (ref 3.5–5.0)
Alkaline Phosphatase: 55 U/L (ref 38–126)
Anion gap: 11 (ref 5–15)
BUN: 15 mg/dL (ref 6–20)
CHLORIDE: 100 mmol/L — AB (ref 101–111)
CO2: 26 mmol/L (ref 22–32)
CREATININE: 0.92 mg/dL (ref 0.61–1.24)
Calcium: 9.5 mg/dL (ref 8.9–10.3)
GFR calc Af Amer: >60 mL/min (ref >60)
Glucose, Bld: 224 mg/dL — ABNORMAL HIGH (ref 65–99)
POTASSIUM: 3.3 mmol/L — AB (ref 3.5–5.1)
SODIUM: 137 mmol/L (ref 135–145)
Total Bilirubin: 0.6 mg/dL (ref 0.3–1.2)
Total Protein: 7.6 g/dL (ref 6.5–8.1)

## 2016-07-17 LAB — URINALYSIS, ROUTINE W REFLEX MICROSCOPIC
BILIRUBIN URINE: NEGATIVE
Bacteria, UA: NONE SEEN
Glucose, UA: >=500 mg/dL — AB
HGB URINE DIPSTICK: NEGATIVE
KETONES UR: 5 mg/dL — AB
LEUKOCYTES UA: NEGATIVE
Nitrite: NEGATIVE
PH: 5 (ref 5.0–8.0)
Protein, ur: NEGATIVE mg/dL
SPECIFIC GRAVITY, URINE: 1.025 (ref 1.005–1.030)
SQUAMOUS EPITHELIAL / LPF: NONE SEEN

## 2016-07-17 LAB — SALICYLATE LEVEL

## 2016-07-17 LAB — I-STAT TROPONIN, ED: Troponin i, poc: 0 ng/mL (ref 0.00–0.08)

## 2016-07-17 LAB — ETHANOL: Alcohol, Ethyl (B): <5 mg/dL (ref <5)

## 2016-07-17 LAB — LIPASE, BLOOD: LIPASE: 41 U/L (ref 11–51)

## 2016-07-17 LAB — ACETAMINOPHEN LEVEL: Acetaminophen (Tylenol), Serum: <10 ug/mL — ABNORMAL LOW (ref 10–30)

## 2016-07-17 MED ORDER — PROMETHAZINE HCL 25 MG PO TABS: 25.0000 mg | ORAL_TABLET | Freq: Three times a day (TID) | ORAL | 0 refills | Status: DC | PRN

## 2016-07-17 MED ORDER — MAGNESIUM CITRATE PO SOLN
1.0000 | Freq: Once | ORAL | Status: AC
  Administered 2016-07-17: 1 via ORAL
  Filled 2016-07-17: qty 296

## 2016-07-17 MED ORDER — POLYETHYLENE GLYCOL 3350 17 GM/SCOOP PO POWD: 17.0000 g | Freq: Two times a day (BID) | ORAL | 0 refills | Status: DC

## 2016-07-17 MED ORDER — ONDANSETRON HCL 4 MG/2ML IJ SOLN
4.0000 mg | Freq: Once | INTRAMUSCULAR | Status: AC
  Administered 2016-07-17: 4 mg via INTRAVENOUS
  Filled 2016-07-17: qty 2

## 2016-07-17 MED ORDER — SODIUM CHLORIDE 0.9 % IV BOLUS (SEPSIS)
1000.0000 mL | Freq: Once | INTRAVENOUS | Status: AC
  Administered 2016-07-17: 1000 mL via INTRAVENOUS

## 2016-07-17 NOTE — ED Triage Notes (Signed)
Pt states he has been using heroin for 5 months. States that he is wanting to get off.  Last used heroin yesterday. Today feeling "bad" with NVD due to lack of opiates.  Needs resources.

## 2016-07-17 NOTE — ED Notes (Signed)
Nancy NordmannBarbara Northern (Pt's sister): [phone] Contact for Transport/Discharge

## 2016-07-17 NOTE — Congregational Nurse Program (Signed)
Congregational Nurse Program Note  Date of Encounter: 06/09/2016  Past Medical History: Past Medical History:  Diagnosis Date  . Diabetes mellitus without complication ([place])   . Hypertension     Encounter Details:  BP check. Client state that he is diabetic, takes insulin but does not have a functioning glucometer. Kit given. Client will test BGs as he normally does. States that [facility].

## 2016-07-17 NOTE — ED Provider Notes (Signed)
MSE was initiated and I personally evaluated the patient and placed orders (if any) at  2:52 PM on July 17, 2016.  Stephen Bird is a 56 y.o. male with a PMHx of DM2 and HTN, and recent admission for alcohol induced pancreatitis, who presents to the Emergency Department complaining of  10/10, constant, cramping, generalized nonradiating abdominal pain onset this morning. Pt is a current heroin user (three bags a day) who last used yesterday and wants to detox and help to stop using. He has associated symptoms of constipation (last BM three days ago), vomiting, chills, and night sweats. He drank two beers in the past week, none in the past 24 hours. He notes direct pressure and palpation exacerbate his abd pain. He did not try anything PTA for relief of his pain. He has not had any episodes of fluctuance for the past three days. Denies SI/HI/AVH. No h/o abdominal surgeries. Pt denies fever, CP, SOB, diarrhea, hematemesis, melena, hematochezia, hematuria, dysuria, numbness, tingling, focal weakness, and any other associated symptoms at this time. Pt is a current every day smoker.   On exam, pt actively vomiting, with moderate abd TTP diffusely throughout but more focalized in the RUQ, +murphy's, neg mcburney's, slight voluntary guarding but no rebound or rigidity, +BS throughout. Feels warm. Mildly tachycardic.   Pt needs more acute care, work up started and pt moved to the back.  The patient appears stable so that the remainder of the MSE may be completed by another provider.   [address]19 2nd AvenueMercedes Denario Bagot, PA-C 07/17/16 1456    Laurence Spatesachel Morgan Little, MD 07/18/16 [phone]

## 2016-07-18 NOTE — ED Provider Notes (Signed)
[facility] DEPT Provider Note   CSN: [id number] Arrival date & time: 07/17/16 1358     History    Chief Complaint  Patient presents with  . Heroin Detox     HPI Stephen Bird is a 56 y.o. male.  35yo M w/ h/o HTN, T2DM, substance abuse p/w request for heroin detox. Patient states that he has been using heroin for 5 months and he wants to detox. His last use of heroin was yesterday. He went to [facility] where he was sent here for further eval. He reports nausea and vomiting as well as abdominal pain since not using heroin. He has had these withdrawal symptoms previously when he tried to stop air when. These symptoms usually improves after taking Suboxone but he does not have a prescription for this medication. He denies any fevers, urinary symptoms, or recent illness. He denies any other illicit drug use. He endorses constipation with last bowel movement 3 days ago. He does note that he was started on Prozac today but has not taken it yet because of his withdrawal symptoms.   Past Medical History:  Diagnosis Date  . Diabetes mellitus without complication ([name])   . Hypertension      Patient Active Problem List   Diagnosis Date Noted  . Osteoarthritis of hips, bilateral 07/02/2016  . Tobacco dependence 07/02/2016  . Depression 05/21/2016  . Acute pancreatitis 06/08/2015  . Chest pain of uncertain etiology 12/45/8099  . Alcohol abuse 06/08/2015  . Type 2 diabetes mellitus ([name]) 06/08/2015  . Hypertension 06/08/2015  . Alcohol-induced acute pancreatitis     Past Surgical History:  Procedure Laterality Date  . BACK SURGERY    . HYDROCELE EXCISION / REPAIR     1998        Home Medications    Prior to Admission medications   Medication Sig Start Date End Date Taking? Authorizing Provider  DULoxetine (CYMBALTA) 20 MG capsule Take 1 capsule (20 mg total) by mouth daily. 05/21/16  Yes [name], FNP  gabapentin (NEURONTIN) 300 MG capsule Take 1 capsule (300 mg  total) by mouth 3 (three) times daily. 05/21/16  Yes [name], FNP  hydrochlorothiazide (HYDRODIURIL) 25 MG tablet Take 1 tablet (25 mg total) by mouth daily at 2 PM. 07/03/16  Yes [name], FNP  insulin glargine (LANTUS) 100 UNIT/ML injection Inject 0.2 mLs (20 Units total) into the skin at bedtime. 07/02/16  Yes [name], FNP  lisinopril (PRINIVIL,ZESTRIL) 10 MG tablet Take 1 tablet (10 mg total) by mouth daily. 05/21/16  Yes [name], FNP  metFORMIN (GLUCOPHAGE) 500 MG tablet Take 2 tablets (1,000 mg total) by mouth 2 (two) times daily with a meal. 07/03/16  Yes [name], FNP  Blood Glucose Monitoring Suppl (TRUE METRIX AIR GLUCOSE METER) w/Device KIT 1 each by Does not apply route 3 (three) times daily before meals. 05/21/16   [name], FNP  ergocalciferol (VITAMIN D2) 50000 units capsule Take 1 capsule (50,000 Units total) by mouth once a week. 07/03/16   [name], FNP  glucose blood (TRUE METRIX BLOOD GLUCOSE TEST) test strip Use as instructed 05/21/16   [name], FNP  Insulin Syringe-Needle U-100 (INSULIN SYRINGE .3CC/30GX5/16") 30G X 5/16" 0.3 ML MISC 1 each by Does not apply route daily. 05/21/16   [name], FNP  polyethylene glycol powder (GLYCOLAX/MIRALAX) powder Take 17 g by mouth 2 (two) times daily. Mix 1 capful in drink and take 1  to 3 times daily as needed for constipation 07/17/16   [name], MD  promethazine (PHENERGAN) 25 MG tablet Take 1 tablet (25 mg total) by mouth every 8 (eight) hours as needed for nausea or vomiting. 07/17/16   [name], MD      Family History  Problem Relation Age of Onset  . Family history unknown: Yes     Social History  Substance Use Topics  . Smoking status: Current Every Day Smoker    Packs/day: 0.25    Types: Cigarettes  . Smokeless tobacco: Never Used  . Alcohol use Yes     Comment: 3-4 a week      Allergies     Patient has no known  allergies.    Review of Systems  10 Systems reviewed and are negative for acute change except as noted in the HPI.   Physical Exam Updated Vital Signs BP 128/96 (BP Location: Right Arm)   Pulse 88   Temp 99 F (37.2 C) (Oral)   Resp 16   Ht 5' 11" (1.803 m)   Wt 147 lb (66.7 kg)   SpO2 100%   BMI 20.50 kg/m   Physical Exam  Constitutional: He is oriented to person, place, and time. He appears well-developed and well-nourished. No distress.  HENT:  Head: Normocephalic and atraumatic.  Moist mucous membranes  Eyes: Conjunctivae are normal. Pupils are equal, round, and reactive to light.  Neck: Neck supple.  Cardiovascular: Normal rate, regular rhythm and normal heart sounds.   No murmur heard. Pulmonary/Chest: Effort normal and breath sounds normal.  Abdominal: Soft. Bowel sounds are normal. He exhibits no distension. There is tenderness. There is no rebound and no guarding.  TTP RUQ, R mid-abdomen, midepigastrium  Musculoskeletal: He exhibits no edema.  Neurological: He is alert and oriented to person, place, and time.  Fluent speech  Skin: Skin is warm and dry.  Psychiatric: He has a normal mood and affect. Judgment normal.  Nursing note and vitals reviewed.     ED Treatments / Results  Labs (all labs ordered are listed, but only abnormal results are displayed) Labs Reviewed  COMPREHENSIVE METABOLIC PANEL - Abnormal; Notable for the following:       Result Value   Potassium 3.3 (*)    Chloride 100 (*)    Glucose, Bld 224 (*)    ALT 11 (*)    All other components within normal limits  URINALYSIS, ROUTINE W REFLEX MICROSCOPIC - Abnormal; Notable for the following:    Glucose, UA >=500 (*)    Ketones, ur 5 (*)    All other components within normal limits  ACETAMINOPHEN LEVEL - Abnormal; Notable for the following:    Acetaminophen (Tylenol), Serum <10 (*)    All other components within normal limits  LIPASE, BLOOD  ETHANOL  SALICYLATE LEVEL  RAPID URINE  DRUG SCREEN, HOSP PERFORMED  CBC WITH DIFFERENTIAL/PLATELET  I-STAT TROPOININ, ED     EKG  EKG Interpretation  Date/Time:  Thursday July 17 2016 17:27:46 EST Ventricular Rate:  79 PR Interval:    QRS Duration: 89 QT Interval:  376 QTC Calculation: 431 R Axis:   81 Text Interpretation:  Sinus rhythm Probable anteroseptal infarct, old Borderline ST elevation, lateral leads No significant change since last tracing Confirmed by LITTLE MD, RACHEL [phone]) on 07/17/2016 7:06:25 PM         Radiology Dg Abd 1 View  Result Date: 07/17/2016 CLINICAL DATA:  Constipation for 3 days.  Vomiting. EXAM: ABDOMEN - 1 VIEW COMPARISON:  None. FINDINGS: Moderate amount of stool within the colon. The bowel gas pattern is nonobstructive. No mass lesion identified. Visualized lung bases are clear. No acute osseous abnormality. IMPRESSION: Moderate amount of colonic stool. No radiographic evidence of small-bowel obstruction. Electronically Signed   By: Ulyses Jarred M.D.   On: 07/17/2016 20:09   US Abdomen Complete  Result Date: 07/17/2016 CLINICAL DATA:  Right upper quadrant abdominal pain for 2 weeks. EXAM: ABDOMEN ULTRASOUND COMPLETE COMPARISON:  None. FINDINGS: Gallbladder: Gallbladder is partially contracted. No gallstones are noted. Gallbladder wall thickness is measured at 4 mm which may be due to contracted state. No sonographic Murphy's sign or pericholecystic fluid is noted. Common bile duct: Diameter: 1.1 mm which is within normal limits. Liver: No focal lesion identified. Within normal limits in parenchymal echogenicity. IVC: No abnormality visualized. Pancreas: Visualized portion unremarkable. Spleen: Size and appearance within normal limits. Right Kidney: Length: 11.9 cm. 1.7 cm septated cystic abnormality is seen in upper pole of right kidney consistent with Bosniak type 2 lesion. 1.3 mm calculus seen in upper pole. Echogenicity within normal limits. No mass or hydronephrosis visualized. Left  Kidney: Length: 12.1 cm. 1.4 mm calculus seen in midpole. Echogenicity within normal limits. No mass or hydronephrosis visualized. Abdominal aorta: No aneurysm visualized. Other findings: None. IMPRESSION: Gallbladder is partially contracted. No gallstones or pericholecystic fluid is noted. Mild gallbladder wall thickening is noted which may be due to contracted state. 1.7 cm septated cystic abnormality seen in upper pole of right kidney consistent with Bosniak type 2 lesion. Bilateral nonobstructive nephrolithiasis. No other abnormality seen in the abdomen. Electronically Signed   By: Marijo Conception, M.D.   On: 07/17/2016 16:04    Procedures Procedures (including critical care time) Procedures  Medications Ordered in ED  Medications  sodium chloride 0.9 % bolus 1,000 mL (0 mLs Intravenous Stopped 07/17/16 1738)  ondansetron (ZOFRAN) injection 4 mg (4 mg Intravenous Given 07/17/16 1550)  magnesium citrate solution 1 Bottle (1 Bottle Oral Given 07/17/16 2109)     Initial Impression / Assessment and Plan / ED Course  I have reviewed the triage vital signs and the nursing notes.  Pertinent labs & imaging results that were available during my care of the patient were reviewed by me and considered in my medical decision making (see chart for details).     Pt presents requesting detox from heroin, last use yesterday. Has had nausea and vomiting as well as abdominal pain consistent with previous withdrawal symptoms. He was nontoxic on exam with normal vital signs. He did have right upper quadrant, right mid abdomen, and epigastric abdominal tenderness without rebound or guarding. Labwork overall unremarkable, glucose 224 with no other abnormalities. Abdominal ultrasound ordered by screening PA showed no acute findings including no concerning findings of gallbladder. I obtained KUB to evaluate for evidence of obstruction given his report of constipation. KUB shows moderate amount of stool with no  evidence of SBO. Patient was able to tolerate magnesium citrate with no problems. I provided him with outpatient detox resources as well as MiraLAX and Phenergan to use at home for symptom relief. Reviewed return precautions and patient discharged in satisfactory condition.  Final Clinical Impressions(s) / ED Diagnoses   Final diagnoses:  Heroin abuse  Constipation, unspecified constipation type     Discharge Medication List as of 07/17/2016  9:50 PM    START taking these medications   Details  polyethylene glycol powder (GLYCOLAX/MIRALAX) powder Take  17 g by mouth 2 (two) times daily. Mix 1 capful in drink and take 1 to 3 times daily as needed for constipation, Starting Thu 07/17/2016, Print    promethazine (PHENERGAN) 25 MG tablet Take 1 tablet (25 mg total) by mouth every 8 (eight) hours as needed for nausea or vomiting., Starting Thu 07/17/2016, Print           [name], MD 07/18/16 [phone]

## 2016-07-19 NOTE — Congregational Nurse Program (Signed)
Congregational Nurse Program Note  Date of Encounter: 06/09/2016  Past Medical History: Past Medical History:  Diagnosis Date  . Diabetes mellitus without complication ([facility])   . Hypertension     Encounter Details:   BP check

## 2016-08-04 ENCOUNTER — Emergency Department (HOSPITAL_COMMUNITY)
Admission: EM | Admit: 2016-08-04 | Discharge: 2016-08-05 | Disposition: A | Discharge status: Home / Self Care | Payer: Medicaid - Out of State | Attending: Emergency Medicine | Admitting: Emergency Medicine

## 2016-08-04 DIAGNOSIS — Z794 Long term (current) use of insulin: Secondary | ICD-10-CM | POA: Insufficient documentation

## 2016-08-04 DIAGNOSIS — R739 Hyperglycemia, unspecified: Secondary | ICD-10-CM

## 2016-08-04 DIAGNOSIS — Z79899 Other long term (current) drug therapy: Secondary | ICD-10-CM | POA: Insufficient documentation

## 2016-08-04 DIAGNOSIS — F17210 Nicotine dependence, cigarettes, uncomplicated: Secondary | ICD-10-CM | POA: Insufficient documentation

## 2016-08-04 DIAGNOSIS — R066 Hiccough: Secondary | ICD-10-CM

## 2016-08-04 DIAGNOSIS — I10 Essential (primary) hypertension: Secondary | ICD-10-CM | POA: Insufficient documentation

## 2016-08-04 DIAGNOSIS — E1165 Type 2 diabetes mellitus with hyperglycemia: Secondary | ICD-10-CM | POA: Insufficient documentation

## 2016-08-04 HISTORY — DX: Sickle-cell disease without crisis: D57.1

## 2016-08-04 NOTE — ED Notes (Signed)
Bed: WA06 Expected date:  Expected time:  Means of arrival:  Comments: 56 yo M/ hiccups

## 2016-08-05 ENCOUNTER — Encounter (HOSPITAL_COMMUNITY): Payer: Self-pay | Admitting: Emergency Medicine

## 2016-08-05 LAB — BASIC METABOLIC PANEL
Anion gap: 9 (ref 5–15)
BUN: 13 mg/dL (ref 6–20)
CO2: 27 mmol/L (ref 22–32)
Calcium: 9.3 mg/dL (ref 8.9–10.3)
Chloride: 98 mmol/L — ABNORMAL LOW (ref 101–111)
Creatinine, Ser: 0.92 mg/dL (ref 0.61–1.24)
GFR calc Af Amer: >60 mL/min (ref >60)
GFR calc non Af Amer: >60 mL/min (ref >60)
GLUCOSE: 291 mg/dL — AB (ref 65–99)
POTASSIUM: 4.4 mmol/L (ref 3.5–5.1)
Sodium: 134 mmol/L — ABNORMAL LOW (ref 135–145)

## 2016-08-05 LAB — CBC
HEMATOCRIT: 37.5 % — AB (ref 39.0–52.0)
HEMOGLOBIN: 13 g/dL (ref 13.0–17.0)
MCH: 29.9 pg (ref 26.0–34.0)
MCHC: 34.7 g/dL (ref 30.0–36.0)
MCV: 86.2 fL (ref 78.0–100.0)
Platelets: 187 10*3/uL (ref 150–400)
RBC: 4.35 MIL/uL (ref 4.22–5.81)
RDW: 13.1 % (ref 11.5–15.5)
WBC: 5.6 10*3/uL (ref 4.0–10.5)

## 2016-08-05 LAB — URINALYSIS, ROUTINE W REFLEX MICROSCOPIC
BACTERIA UA: NONE SEEN
BILIRUBIN URINE: NEGATIVE
Hgb urine dipstick: NEGATIVE
KETONES UR: 5 mg/dL — AB
Leukocytes, UA: NEGATIVE
NITRITE: NEGATIVE
PROTEIN: NEGATIVE mg/dL
Specific Gravity, Urine: 1.025 (ref 1.005–1.030)
pH: 5 (ref 5.0–8.0)

## 2016-08-05 LAB — CBG MONITORING, ED: Glucose-Capillary: 270 mg/dL — ABNORMAL HIGH (ref 65–99)

## 2016-08-05 LAB — LIPASE, BLOOD: Lipase: 21 U/L (ref 11–51)

## 2016-08-05 LAB — RAPID URINE DRUG SCREEN, HOSP PERFORMED
AMPHETAMINES: NOT DETECTED
BARBITURATES: NOT DETECTED
Benzodiazepines: NOT DETECTED
Cocaine: POSITIVE — AB
Opiates: POSITIVE — AB
TETRAHYDROCANNABINOL: NOT DETECTED

## 2016-08-05 LAB — ETHANOL

## 2016-08-05 MED ORDER — SODIUM CHLORIDE 0.9 % IV SOLN
25.0000 mg | Freq: Once | INTRAVENOUS | Status: AC
  Administered 2016-08-05: 25 mg via INTRAVENOUS
  Filled 2016-08-05: qty 1

## 2016-08-05 NOTE — ED Triage Notes (Signed)
Per EMS , pt. Picked up from parking of [employer] with complaint of hyperglycemia, hiccups and abdominal pain x3 days. Pt. Stated that he run out of insulin syringe x 3 days but has the insulin medicine. Pt. Also complaint of abdominal pain with nausea. cbg via EMS is 388mg /dl.

## 2016-08-05 NOTE — ED Notes (Signed)
PA at bedside.

## 2016-08-05 NOTE — ED Notes (Signed)
Pt. Admitted of drinking a bottle of beer at 4pm but stated that he drinks a bottle a day without making his stomach upset, also stated  That he vomited with blood x3 in minimal amount  an hour ago prior to calling EMS.

## 2016-08-05 NOTE — ED Provider Notes (Signed)
[facility] DEPT Provider Note   CSN: [id number] Arrival date & time: 08/04/16  2357   By signing my name below, I, Stephen Bird, attest that this documentation has been prepared under the direction and in the presence of Stephen Mail, PA-C. Electronically Signed: Neta Bird, ED Scribe. 08/05/2016. 1:01 AM.   History   Chief Complaint Chief Complaint  Patient presents with  . Hyperglycemia  . Abdominal Pain    The history is provided by the patient. No language interpreter was used.   HPI Comments:  Stephen Bird is a 56 y.o. male with PMHx of DM who presents to the Emergency Department with complains of constant hiccups x 3 days. Pt states that he ran out of insulin 3 days ago and is hyperglycemic. Pt reports that he has had hiccups for 3 days which are causing him to have abdominal pain. Pt states that he had 1 episode of self-induced vomiting and states that he saw blood in his vomit. Pt report hx of neuropathy. Pt drinks 3 12oz cans of beer a day. No alleviating factors noted. Pt denies other associated symptoms.   Past Medical History:  Diagnosis Date  . Diabetes mellitus without complication ([place])   . Hypertension   . Sickle cell disease [facility])     Patient Active Problem List   Diagnosis Date Noted  . Osteoarthritis of hips, bilateral 07/02/2016  . Tobacco dependence 07/02/2016  . Depression 05/21/2016  . Acute pancreatitis 06/08/2015  . Chest pain of uncertain etiology 71/10/2692  . Alcohol abuse 06/08/2015  . Type 2 diabetes mellitus ([place]) 06/08/2015  . Hypertension 06/08/2015  . Alcohol-induced acute pancreatitis     Past Surgical History:  Procedure Laterality Date  . BACK SURGERY    . HYDROCELE EXCISION / REPAIR     1998       Home Medications    Prior to Admission medications   Medication Sig Start Date End Date Taking? Authorizing Provider  Blood Glucose Monitoring Suppl (TRUE METRIX AIR GLUCOSE METER) w/Device KIT 1 each by Does not  apply route 3 (three) times daily before meals. 05/21/16   [name], FNP  DULoxetine (CYMBALTA) 20 MG capsule Take 1 capsule (20 mg total) by mouth daily. 05/21/16   [name], FNP  ergocalciferol (VITAMIN D2) 50000 units capsule Take 1 capsule (50,000 Units total) by mouth once a week. 07/03/16   [name], FNP  gabapentin (NEURONTIN) 300 MG capsule Take 1 capsule (300 mg total) by mouth 3 (three) times daily. 05/21/16   [name], FNP  glucose blood (TRUE METRIX BLOOD GLUCOSE TEST) test strip Use as instructed 05/21/16   [name], FNP  hydrochlorothiazide (HYDRODIURIL) 25 MG tablet Take 1 tablet (25 mg total) by mouth daily at 2 PM. 07/03/16   [name], FNP  insulin glargine (LANTUS) 100 UNIT/ML injection Inject 0.2 mLs (20 Units total) into the skin at bedtime. 07/02/16   [name], FNP  Insulin Syringe-Needle U-100 (INSULIN SYRINGE .3CC/30GX5/16") 30G X 5/16" 0.3 ML MISC 1 each by Does not apply route daily. 05/21/16   [name], FNP  lisinopril (PRINIVIL,ZESTRIL) 10 MG tablet Take 1 tablet (10 mg total) by mouth daily. 05/21/16   [name], FNP  metFORMIN (GLUCOPHAGE) 500 MG tablet Take 2 tablets (1,000 mg total) by mouth 2 (two) times daily with a meal. 07/03/16   [name], FNP  polyethylene glycol powder (GLYCOLAX/MIRALAX) powder Take 17 g by  mouth 2 (two) times daily. Mix 1 capful in drink and take 1 to 3 times daily as needed for constipation 07/17/16   [name], MD  promethazine (PHENERGAN) 25 MG tablet Take 1 tablet (25 mg total) by mouth every 8 (eight) hours as needed for nausea or vomiting. 07/17/16   [name], MD    Family History Family History  Problem Relation Age of Onset  . Family history unknown: Yes    Social History Social History  Substance Use Topics  . Smoking status: Current Every Day Smoker    Packs/day: 0.25    Types: Cigarettes  . Smokeless tobacco: Never Used  . Alcohol  use Yes     Comment: 3-4 a week      Allergies   Patient has no known allergies.   Review of Systems Review of Systems 10 systems reviewed and all are negative for acute change except as noted in the HPI.    Physical Exam Updated Vital Signs BP 170/99   Pulse 115   Temp 98.6 F (37 C) (Oral)   Resp 23   SpO2 96%   Physical Exam  Constitutional: He appears well-developed and well-nourished. No distress.  Patient actively hiccuping repeatedly  HENT:  Head: Normocephalic and atraumatic.  Eyes: Conjunctivae are normal. No scleral icterus.  Neck: Normal range of motion. Neck supple.  Cardiovascular: Normal rate, regular rhythm and normal heart sounds.   Pulmonary/Chest: Effort normal and breath sounds normal. No respiratory distress.  Abdominal: Soft. He exhibits no distension. There is no tenderness.  Musculoskeletal: He exhibits no edema.  Neurological: He is alert.  Skin: Skin is warm and dry. He is not diaphoretic.  Psychiatric: He has a normal mood and affect. He is hyperactive.  Nursing note and vitals reviewed.    ED Treatments / Results  DIAGNOSTIC STUDIES:  Oxygen Saturation is 95% on RA, normal by my interpretation.    COORDINATION OF CARE:  1:01 AM Discussed treatment plan with pt at bedside and pt agreed to plan.   Labs (all labs ordered are listed, but only abnormal results are displayed) Labs Reviewed  BASIC METABOLIC PANEL - Abnormal; Notable for the following:       Result Value   Sodium 134 (*)    Chloride 98 (*)    Glucose, Bld 291 (*)    All other components within normal limits  CBC - Abnormal; Notable for the following:    HCT 37.5 (*)    All other components within normal limits  URINALYSIS, ROUTINE W REFLEX MICROSCOPIC - Abnormal; Notable for the following:    Glucose, UA >=500 (*)    Ketones, ur 5 (*)    Squamous Epithelial / LPF 0-5 (*)    All other components within normal limits  CBG MONITORING, ED - Abnormal; Notable for the  following:    Glucose-Capillary 270 (*)    All other components within normal limits  LIPASE, BLOOD  ETHANOL  RAPID URINE DRUG SCREEN, HOSP PERFORMED    EKG  EKG Interpretation  Date/Time:  Tuesday August 05 2016 00:55:24 EST Ventricular Rate:  95 PR Interval:    QRS Duration: 85 QT Interval:  352 QTC Calculation: 443 R Axis:   75 Text Interpretation:  Sinus arrhythmia Probable anteroseptal infarct, old No significant change since last tracing Interpretation limited secondary to artifact Confirmed by Glynn Octave [phone]) on 08/05/2016 12:57:35 AM       Radiology No results found.  Procedures Procedures (including  critical care time)  Medications Ordered in ED Medications - No data to display   Initial Impression / Assessment and Plan / ED Course  I have reviewed the triage vital signs and the nursing notes.  Pertinent labs & imaging results that were available during my care of the patient were reviewed by me and considered in my medical decision making (see chart for details).     Patient with intractable hiccups. Given IV Thorazine with interruption of the hiccups. Patient does have hyperglycemia. However, he does not have an anion gap. His hiccups have resolved after administration of this medication. Patient be discharged to follow-up with his PCP. Appears safe for discharge at this time  Final Clinical Impressions(s) / ED Diagnoses   Final diagnoses:  Hiccups  Hyperglycemia    New Prescriptions New Prescriptions   No medications on file  I personally performed the services described in this documentation, which was scribed in my presence. The recorded information has been reviewed and is accurate.       Stephen Mail, PA-C 08/05/16 [place], MD 08/05/16 [phone]

## 2016-08-05 NOTE — Discharge Instructions (Signed)
Contact a health care provider if: Your hiccups last for more than 48 hours. Your hiccups do not improve with treatment. You cannot sleep or eat due to the hiccups. You have unexpected weight loss due to the hiccups. You have a fever. You have trouble breathing or swallowing. You develop severe pain in your abdomen. You develop numbness, tingling, or weakness.

## 2016-08-05 NOTE — ED Notes (Signed)
Per MD order U/A. Urinal placed at bedside for pt to use when able to provide sample. [employer]

## 2016-08-05 NOTE — ED Notes (Signed)
Bus pass given 

## 2016-08-06 ENCOUNTER — Encounter (HOSPITAL_COMMUNITY): Payer: Self-pay

## 2016-08-06 ENCOUNTER — Emergency Department (HOSPITAL_COMMUNITY)
Admission: EM | Admit: 2016-08-06 | Discharge: 2016-08-06 | Disposition: A | Discharge status: Home / Self Care | Payer: Medicaid - Out of State | Attending: Emergency Medicine | Admitting: Emergency Medicine

## 2016-08-06 DIAGNOSIS — F17210 Nicotine dependence, cigarettes, uncomplicated: Secondary | ICD-10-CM | POA: Insufficient documentation

## 2016-08-06 DIAGNOSIS — I10 Essential (primary) hypertension: Secondary | ICD-10-CM | POA: Insufficient documentation

## 2016-08-06 DIAGNOSIS — Z794 Long term (current) use of insulin: Secondary | ICD-10-CM | POA: Insufficient documentation

## 2016-08-06 DIAGNOSIS — T401X1A Poisoning by heroin, accidental (unintentional), initial encounter: Secondary | ICD-10-CM | POA: Insufficient documentation

## 2016-08-06 DIAGNOSIS — E119 Type 2 diabetes mellitus without complications: Secondary | ICD-10-CM | POA: Insufficient documentation

## 2016-08-06 DIAGNOSIS — Y639 Failure in dosage during unspecified surgical and medical care: Secondary | ICD-10-CM | POA: Insufficient documentation

## 2016-08-06 HISTORY — DX: Other psychoactive substance abuse, uncomplicated: F19.10

## 2016-08-06 HISTORY — DX: Alcohol abuse, uncomplicated: F10.10

## 2016-08-06 LAB — CBG MONITORING, ED: GLUCOSE-CAPILLARY: 207 mg/dL — AB (ref 65–99)

## 2016-08-06 NOTE — ED Provider Notes (Signed)
[facility] DEPT Provider Note   CSN: [id number] Arrival date & time: 08/06/16  [place]     History   Chief Complaint Chief Complaint  Patient presents with  . Drug Overdose    HPI Stephen Bird is a 56 y.o. male.  HPI   56 year old male presents status post drug overdose.  Patient reports he was snorting heroin and drinking alcohol earlier in the day.  He does not recall any of the events surrounding his transfer to the hospital.  Per EMS patient received 2 mg IM Narcan via fire, 1 mg via EMS with back to baseline alert status.  Upon my initial evaluation patient sleeping in exam bed, he was aroused, alert and oriented, denied any complaints.  Denies any chest pain shortness of breath, cough.  Past Medical History:  Diagnosis Date  . Alcohol abuse   . Diabetes mellitus without complication ([place])   . Drug abuse   . Hypertension   . Sickle cell disease [facility])     Patient Active Problem List   Diagnosis Date Noted  . Osteoarthritis of hips, bilateral 07/02/2016  . Tobacco dependence 07/02/2016  . Depression 05/21/2016  . Acute pancreatitis 06/08/2015  . Chest pain of uncertain etiology 93/26/7124  . Alcohol abuse 06/08/2015  . Type 2 diabetes mellitus ([place]) 06/08/2015  . Hypertension 06/08/2015  . Alcohol-induced acute pancreatitis     Past Surgical History:  Procedure Laterality Date  . BACK SURGERY    . HYDROCELE EXCISION / REPAIR     1998       Home Medications    Prior to Admission medications   Medication Sig Start Date End Date Taking? Authorizing Provider  DULoxetine (CYMBALTA) 20 MG capsule Take 1 capsule (20 mg total) by mouth daily. 05/21/16  Yes [name], FNP  gabapentin (NEURONTIN) 300 MG capsule Take 1 capsule (300 mg total) by mouth 3 (three) times daily. 05/21/16  Yes [name], FNP  hydrochlorothiazide (HYDRODIURIL) 25 MG tablet Take 1 tablet (25 mg total) by mouth daily at 2 PM. 07/03/16  Yes [name], FNP  insulin glargine  (LANTUS) 100 UNIT/ML injection Inject 0.2 mLs (20 Units total) into the skin at bedtime. 07/02/16  Yes [name], FNP  lisinopril (PRINIVIL,ZESTRIL) 10 MG tablet Take 1 tablet (10 mg total) by mouth daily. 05/21/16  Yes [name], FNP  metFORMIN (GLUCOPHAGE) 500 MG tablet Take 2 tablets (1,000 mg total) by mouth 2 (two) times daily with a meal. 07/03/16  Yes [name], FNP  methocarbamol (ROBAXIN) 500 MG tablet Take 500 mg by mouth every 6 (six) hours as needed for muscle spasms.  06/12/16  Yes Historical Provider, MD  Blood Glucose Monitoring Suppl (TRUE METRIX AIR GLUCOSE METER) w/Device KIT 1 each by Does not apply route 3 (three) times daily before meals. 05/21/16   [name], FNP  ergocalciferol (VITAMIN D2) 50000 units capsule Take 1 capsule (50,000 Units total) by mouth once a week. 07/03/16   [name], FNP  glucose blood (TRUE METRIX BLOOD GLUCOSE TEST) test strip Use as instructed 05/21/16   [name], FNP  Insulin Syringe-Needle U-100 (INSULIN SYRINGE .3CC/30GX5/16") 30G X 5/16" 0.3 ML MISC 1 each by Does not apply route daily. 05/21/16   [name], FNP  polyethylene glycol powder (GLYCOLAX/MIRALAX) powder Take 17 g by mouth 2 (two) times daily. Mix 1 capful in drink and take 1 to 3 times daily as needed for constipation 07/17/16  [name], MD  promethazine (PHENERGAN) 25 MG tablet Take 1 tablet (25 mg total) by mouth every 8 (eight) hours as needed for nausea or vomiting. 07/17/16   [name], MD    Family History Family History  Problem Relation Age of Onset  . Family history unknown: Yes    Social History Social History  Substance Use Topics  . Smoking status: Current Every Day Smoker    Packs/day: 0.25    Types: Cigarettes  . Smokeless tobacco: Never Used  . Alcohol use Yes     Comment: DAILY- BEER     Allergies   Patient has no known allergies.   Review of Systems Review of Systems  All other systems  reviewed and are negative.    Physical Exam Updated Vital Signs BP (!) 161/106 (BP Location: Left Arm)   Pulse 96   Temp 98.1 F (36.7 C) (Oral)   Resp 14   Ht _0  (1.803 m)   Wt 66.2 kg   SpO2 100%   BMI 20.36 kg/m   Physical Exam  Constitutional: He is oriented to person, place, and time. He appears well-developed and well-nourished.  HENT:  Head: Normocephalic and atraumatic.  Eyes: Conjunctivae are normal. Pupils are equal, round, and reactive to light. Right eye exhibits no discharge. Left eye exhibits no discharge. No scleral icterus.  Neck: Normal range of motion. No JVD present. No tracheal deviation present.  Pulmonary/Chest: Effort normal. No stridor.  Neurological: He is alert and oriented to person, place, and time. Coordination normal.  Psychiatric: He has a normal mood and affect. His behavior is normal. Judgment and thought content normal.  Nursing note and vitals reviewed.   ED Treatments / Results  Labs (all labs ordered are listed, but only abnormal results are displayed) Labs Reviewed  CBG MONITORING, ED - Abnormal; Notable for the following:       Result Value   Glucose-Capillary 207 (*)    All other components within normal limits    EKG  EKG Interpretation None       Radiology No results found.  Procedures Procedures (including critical care time)  Medications Ordered in ED Medications - No data to display   Initial Impression / Assessment and Plan / ED Course  I have reviewed the triage vital signs and the nursing notes.  Pertinent labs & imaging results that were available during my care of the patient were reviewed by me and considered in my medical decision making (see chart for details).      Final Clinical Impressions(s) / ED Diagnoses   Final diagnoses:  Accidental overdose of heroin, initial encounter    Labs: CBG 207  Imaging:  Consults:  Therapeutics:  Discharge Meds:   Assessment/Plan: 56 year old male  presents status post heroin overdose.  He is alert and oriented here.  Lung sounds clear.  Patient was monitored here in the ED with no rebound symptoms.  He will be discharged home with primary care follow-up and strict return precautions.  Patient verbalized understanding and agreement to today's plan had no further questions or concerns at time discharge    New Prescriptions New Prescriptions   No medications on file     Okey Regal, PA-C 08/06/16 2147    Gareth Morgan, MD 08/07/16 1422

## 2016-08-06 NOTE — ED Triage Notes (Addendum)
PT RECEIVED VIA EMS FOR A DRUG OVERDOSE. PER EMS, THE PT WAS FOUND UNRESPONSIVE ON THE FLOOR OF A FRIEND'S HOUSE. PT WAS GIVEN NARCAN 2MG  IM BY FIRE RESCUE. PT HAD AGONAL BREATHING WITH BVM, O2 SAT AT 60%. EMS ARRIVED AND GAVE NARCAN 1MG  IV. PT WOKE UP AND WAS ABLE TO ANSWER QUESTIONS, BUT FELT LIGHTHEADED. PT ADMITTED TO DRINKING BEER AND USING HEROIN. PT STS HE DOES BOTH DAILY. PT STS HE DID NOT TAKE ANY INSULIN TODAY. DENIES PAIN, SI/HI. BP 180/100 HR 100 O2 SAT 100% RA CBG 219

## 2016-08-06 NOTE — Discharge Instructions (Signed)
Please read attached information. If you experience any new or worsening signs or symptoms please return to the emergency room for evaluation. Please follow-up with your primary care provider or specialist as discussed.  °

## 2016-08-06 NOTE — ED Notes (Signed)
Bed: WA23 Expected date:  Expected time:  Means of arrival:  Comments: EMS 

## 2016-08-06 NOTE — ED Notes (Signed)
PT DISCHARGED. INSTRUCTIONS GIVEN. AAOX4. PT IN NO APPARENT DISTRESS OR PAIN. THE OPPORTUNITY TO ASK QUESTIONS WAS PROVIDED. 

## 2016-08-06 NOTE — ED Notes (Signed)
Pt. CBG 207. RN,Adrian made aware.

## 2016-09-30 ENCOUNTER — Ambulatory Visit (INDEPENDENT_AMBULATORY_CARE_PROVIDER_SITE_OTHER): Payer: Self-pay | Admitting: Family Medicine

## 2016-09-30 ENCOUNTER — Encounter: Payer: Self-pay | Admitting: Family Medicine

## 2016-09-30 VITALS — BP 144/84 | HR 90 | Temp 98.7°F | Resp 14 | Ht 71.0 in | Wt 153.0 lb

## 2016-09-30 DIAGNOSIS — I10 Essential (primary) hypertension: Secondary | ICD-10-CM

## 2016-09-30 DIAGNOSIS — E1122 Type 2 diabetes mellitus with diabetic chronic kidney disease: Secondary | ICD-10-CM

## 2016-09-30 DIAGNOSIS — Z1159 Encounter for screening for other viral diseases: Secondary | ICD-10-CM

## 2016-09-30 DIAGNOSIS — E1149 Type 2 diabetes mellitus with other diabetic neurological complication: Secondary | ICD-10-CM

## 2016-09-30 DIAGNOSIS — Z23 Encounter for immunization: Secondary | ICD-10-CM

## 2016-09-30 DIAGNOSIS — F17200 Nicotine dependence, unspecified, uncomplicated: Secondary | ICD-10-CM

## 2016-09-30 DIAGNOSIS — Z114 Encounter for screening for human immunodeficiency virus [HIV]: Secondary | ICD-10-CM

## 2016-09-30 DIAGNOSIS — B351 Tinea unguium: Secondary | ICD-10-CM

## 2016-09-30 DIAGNOSIS — E559 Vitamin D deficiency, unspecified: Secondary | ICD-10-CM

## 2016-09-30 DIAGNOSIS — F329 Major depressive disorder, single episode, unspecified: Secondary | ICD-10-CM

## 2016-09-30 DIAGNOSIS — F32A Depression, unspecified: Secondary | ICD-10-CM

## 2016-09-30 LAB — COMPLETE METABOLIC PANEL WITH GFR
ALBUMIN: 3.7 g/dL (ref 3.6–5.1)
ALK PHOS: 56 U/L (ref 40–115)
ALT: 11 U/L (ref 9–46)
AST: 20 U/L (ref 10–35)
BILIRUBIN TOTAL: 0.8 mg/dL (ref 0.2–1.2)
BUN: 12 mg/dL (ref 7–25)
CO2: 23 mmol/L (ref 20–31)
Calcium: 9 mg/dL (ref 8.6–10.3)
Chloride: 104 mmol/L (ref 98–110)
Creat: 0.86 mg/dL (ref 0.70–1.33)
Glucose, Bld: 80 mg/dL (ref 65–99)
POTASSIUM: 4 mmol/L (ref 3.5–5.3)
Sodium: 139 mmol/L (ref 135–146)
TOTAL PROTEIN: 6.6 g/dL (ref 6.1–8.1)

## 2016-09-30 LAB — POCT URINALYSIS DIP (DEVICE)
Glucose, UA: NEGATIVE mg/dL
Hgb urine dipstick: NEGATIVE
KETONES UR: 40 mg/dL — AB
LEUKOCYTES UA: NEGATIVE
Nitrite: NEGATIVE
Protein, ur: 100 mg/dL — AB
UROBILINOGEN UA: 0.2 mg/dL (ref 0.0–1.0)
pH: 5.5 (ref 5.0–8.0)

## 2016-09-30 LAB — GLUCOSE, CAPILLARY: GLUCOSE-CAPILLARY: 74 mg/dL (ref 65–99)

## 2016-09-30 LAB — HEPATITIS C ANTIBODY: HCV Ab: NEGATIVE

## 2016-09-30 LAB — POCT GLYCOSYLATED HEMOGLOBIN (HGB A1C): Hemoglobin A1C: 10.2

## 2016-09-30 MED ORDER — INSULIN GLARGINE 100 UNIT/ML ~~LOC~~ SOLN: 30.0000 [IU] | Freq: Every day | SUBCUTANEOUS | 11 refills | Status: DC

## 2016-09-30 MED ORDER — ERGOCALCIFEROL 1.25 MG (50000 UT) PO CAPS: 50000.0000 [IU] | ORAL_CAPSULE | ORAL | 4 refills | Status: DC

## 2016-09-30 MED ORDER — DULOXETINE HCL 20 MG PO CPEP: 20.0000 mg | ORAL_CAPSULE | Freq: Every day | ORAL | 1 refills | Status: DC

## 2016-09-30 MED ORDER — METFORMIN HCL 500 MG PO TABS: 1000.0000 mg | ORAL_TABLET | Freq: Two times a day (BID) | ORAL | 1 refills | Status: DC

## 2016-09-30 MED ORDER — HYDROCHLOROTHIAZIDE 25 MG PO TABS: 25.0000 mg | ORAL_TABLET | Freq: Every day | ORAL | 1 refills | Status: DC

## 2016-09-30 MED ORDER — GABAPENTIN 300 MG PO CAPS: 300.0000 mg | ORAL_CAPSULE | Freq: Four times a day (QID) | ORAL | 1 refills | Status: DC

## 2016-09-30 NOTE — Progress Notes (Signed)
Subjective:    Patient ID: Stephen Bird, male    DOB: 1961/05/28, 56 y.o.   MRN: [id number]  Mr. Khanh Tanori, a 56 year old male with a history of uncontrolled DMII and hypertension present for a follow up of chronic conditions. Mr. Radell says that he has not been taking his medications consistently due to his current living situation. He says that he is homeless and has been living at a shelter in [facility]. It is difficult to get refills of medications. He has to eat meals at the shelter, which are often not consistent with a diabetic diet.    Hypertension  This is a recurrent problem. The current episode started more than 1 year ago. The problem has been gradually improving since onset. The problem is uncontrolled. Pertinent negatives include no anxiety, blurred vision, chest pain, headaches, malaise/fatigue, neck pain, orthopnea, palpitations, peripheral edema, PND, shortness of breath or sweats. There are no associated agents to hypertension. Risk factors for coronary artery disease include diabetes mellitus and dyslipidemia. Past treatments include calcium channel blockers. The current treatment provides mild improvement. Compliance problems include diet, medication cost, exercise and psychosocial issues.  There is no history of angina, kidney disease, CAD/MI, CVA, heart failure, left ventricular hypertrophy, PVD or retinopathy. There is no history of chronic renal disease, coarctation of the aorta, hyperaldosteronism, hypercortisolism, hyperparathyroidism, a hypertension causing med, pheochromocytoma, renovascular disease, sleep apnea or a thyroid problem.  Diabetes  He presents for his follow-up diabetic visit. He has type 2 diabetes mellitus. His disease course has been fluctuating (Mr. Mceachin says that he has not taken Lantus in greater than 1 week. ). Hypoglycemia symptoms include nervousness/anxiousness. Pertinent negatives for hypoglycemia include no dizziness, headaches or sweats.  Associated symptoms include weakness. Pertinent negatives for diabetes include no blurred vision, no chest pain, no fatigue, no foot ulcerations, no polydipsia, no polyphagia and no polyuria. Pertinent negatives for hypoglycemia complications include no hospitalization. Symptoms are stable. Diabetic complications include nephropathy. Pertinent negatives for diabetic complications include no CVA, PVD or retinopathy. Risk factors for coronary artery disease include dyslipidemia and hypertension. Current diabetic treatment includes insulin injections and oral agent (monotherapy). He is compliant with treatment some of the time. He is following a generally unhealthy diet. When asked about meal planning, he reported none. Home blood sugar record trend: Infrequently monitoring blood sugar. An ACE inhibitor/angiotensin II receptor blocker is being taken. He does not see a podiatrist.Eye exam is not current.  medications.   Past Medical History:  Diagnosis Date  . Alcohol abuse   . Diabetes mellitus without complication (HCC)   . Drug abuse   . Hypertension   . Sickle cell disease [facility])    Social History   Social History Narrative  . No narrative on file   Immunization History  Administered Date(s) Administered  . Influenza Split 04/11/2015  . Pneumococcal Polysaccharide-23 06/09/2015  No Known Allergies   Review of Systems  Constitutional: Positive for unexpected weight change (weight loss). Negative for fatigue, fever and malaise/fatigue.  Eyes: Positive for photophobia. Negative for blurred vision.  Respiratory: Negative.  Negative for shortness of breath.   Cardiovascular: Negative.  Negative for chest pain, palpitations, orthopnea and PND.  Gastrointestinal: Negative.   Endocrine: Negative for polydipsia, polyphagia and polyuria.  Genitourinary: Negative.   Musculoskeletal: Positive for back pain and myalgias. Negative for neck pain.  Skin: Negative.   Allergic/Immunologic: Negative.   Negative for immunocompromised state.  Neurological: Positive for weakness and numbness. Negative for  dizziness and headaches.  Hematological: Negative.   Psychiatric/Behavioral: Positive for decreased concentration. Negative for suicidal ideas. The patient is nervous/anxious.        Depression       Objective:   Physical Exam  Constitutional: He is oriented to person, place, and time. He appears well-developed and well-nourished.  HENT:  Head: Normocephalic and atraumatic.  Right Ear: External ear normal.  Left Ear: External ear normal.  Mouth/Throat: Oropharynx is clear and moist.  Eyes: Conjunctivae are normal. Pupils are equal, round, and reactive to light.  Neck: Normal range of motion. Neck supple.  Cardiovascular: Normal rate, regular rhythm, normal heart sounds and intact distal pulses.   Pulmonary/Chest: Effort normal and breath sounds normal.  Abdominal: Soft. Bowel sounds are normal.  Musculoskeletal:       Lumbar back: He exhibits decreased range of motion, pain and spasm.  Ambulating with a cane  Neurological: He is alert and oriented to person, place, and time. He has normal strength and normal reflexes. He displays a negative Romberg sign.  Skin: Skin is warm and dry.  Psychiatric: He exhibits a depressed mood. He expresses no homicidal and no suicidal ideation.       BP (!) 144/84 (BP Location: Right Arm, Patient Position: Sitting, Cuff Size: Normal)   Pulse 90   Temp 98.7 F (37.1 C) (Oral)   Resp 14   Ht  (1.803 m)   Wt 153 lb (69.4 kg)   SpO2 100%   BMI 21.34 kg/m  Assessment & Plan:  1. Type 2 diabetes mellitus with chronic kidney disease, without long-term current use of insulin, unspecified CKD stage (HCC) Will increase Lantus to 30 units HS.  Discussed the importance of making an effort to adhere to medication regimen.  - HgB A1c - metFORMIN (GLUCOPHAGE) 500 MG tablet; Take 2 tablets (1,000 mg total) by mouth 2 (two) times daily with a meal.   Dispense: 120 tablet; Refill: 1 - insulin glargine (LANTUS) 100 UNIT/ML injection; Inject 0.3 mLs (30 Units total) into the skin at bedtime.  Dispense: 10 mL; Refill: 11 - COMPLETE METABOLIC PANEL WITH GFR  2. Essential hypertension Blood pressure is at goal on current medication regimen. He has been without blood pressure medication for greater than 1 week.  - hydrochlorothiazide (HYDRODIURIL) 25 MG tablet; Take 1 tablet (25 mg total) by mouth daily at 2 PM.  Dispense: 90 tablet; Refill: 1 - COMPLETE METABOLIC PANEL WITH GFR  3. Vitamin D deficiency - ergocalciferol (VITAMIN D2) 50000 units capsule; Take 1 capsule (50,000 Units total) by mouth once a week.  Dispense: 12 capsule; Refill: 4  4. Depression, unspecified depression type - DULoxetine (CYMBALTA) 20 MG capsule; Take 1 capsule (20 mg total) by mouth daily.  Dispense: 90 capsule; Refill: 1  5. Other diabetic neurological complication associated with type 2 diabetes mellitus (HCC) - gabapentin (NEURONTIN) 300 MG capsule; Take 1 capsule (300 mg total) by mouth 4 (four) times daily.  Dispense: 120 capsule; Refill: 1  6. Tobacco dependence Smoking cessation instruction/counseling given:  counseled patient on the dangers of tobacco use, advised patient to stop smoking, and reviewed strategies to maximize success  7. Need for Tdap vaccination - Tdap vaccine greater than or equal to 7yo IM  8. Screening for HIV (human immunodeficiency virus) - HIV antibody (with reflex)  9. Need for hepatitis C screening test - Hepatitis C antibody, reflex  10. Onychomycosis Diabetic Foot Exam - Simple   Simple Foot Form Diabetic Foot  exam was performed with the following findings:  Yes 09/30/2016  9:56 AM  Visual Inspection No deformities, no ulcerations, no other skin breakdown bilaterally:  Yes Sensation Testing Intact to touch and monofilament testing bilaterally:  Yes Pulse Check Posterior Tibialis and Dorsalis pulse intact bilaterally:   Yes Comments Toenail thickening primarily to great toenail bilaterally   Patient is in the process of applying for medicaid. Will send a referral to podiatry      RTC: 1 month for DMII   Nolon Nations  MSN, FNP-C [facility] [address] [place], [place] [address] [phone]

## 2016-09-30 NOTE — Patient Instructions (Addendum)
Diabetes: Will increase Lantus to 30 units at bedtime.  Continue Metformin 1000 mg twice daily   Will continue Lisinopril and hydrochlorothiazide. Blood pressure is at goal on medication regimen.   Marland Kitchen DASH Eating Plan DASH stands for "Dietary Approaches to Stop Hypertension." The DASH eating plan is a healthy eating plan that has been shown to reduce high blood pressure (hypertension). It may also reduce your risk for type 2 diabetes, heart disease, and stroke. The DASH eating plan may also help with weight loss. What are tips for following this plan? General guidelines   Avoid eating more than 2,300 mg (milligrams) of salt (sodium) a day. If you have hypertension, you may need to reduce your sodium intake to 1,500 mg a day.  Limit alcohol intake to no more than 1 drink a day for nonpregnant women and 2 drinks a day for men. One drink equals 12 oz of beer, 5 oz of wine, or 1 oz of hard liquor.  Work with your health care provider to maintain a healthy body weight or to lose weight. Ask what an ideal weight is for you.  Get at least 30 minutes of exercise that causes your heart to beat faster (aerobic exercise) most days of the week. Activities may include walking, swimming, or biking.  Work with your health care provider or diet and nutrition specialist (dietitian) to adjust your eating plan to your individual calorie needs. Reading food labels   Check food labels for the amount of sodium per serving. Choose foods with less than 5 percent of the Daily Value of sodium. Generally, foods with less than 300 mg of sodium per serving fit into this eating plan.  To find whole grains, look for the word "whole" as the first word in the ingredient list. Shopping   Buy products labeled as "low-sodium" or "no salt added."  Buy fresh foods. Avoid canned foods and premade or frozen meals. Cooking   Avoid adding salt when cooking. Use salt-free seasonings or herbs instead of table salt or sea salt.  Check with your health care provider or pharmacist before using salt substitutes.  Do not fry foods. Cook foods using healthy methods such as baking, boiling, grilling, and broiling instead.  Cook with heart-healthy oils, such as olive, canola, soybean, or sunflower oil. Meal planning    Eat a balanced diet that includes:  5 or more servings of fruits and vegetables each day. At each meal, try to fill half of your plate with fruits and vegetables.  Up to 6-8 servings of whole grains each day.  Less than 6 oz of lean meat, poultry, or fish each day. A 3-oz serving of meat is about the same size as a deck of cards. One egg equals 1 oz.  2 servings of low-fat dairy each day.  A serving of nuts, seeds, or beans 5 times each week.  Heart-healthy fats. Healthy fats called Omega-3 fatty acids are found in foods such as flaxseeds and coldwater fish, like sardines, salmon, and mackerel.  Limit how much you eat of the following:  Canned or prepackaged foods.  Food that is high in trans fat, such as fried foods.  Food that is high in saturated fat, such as fatty meat.  Sweets, desserts, sugary drinks, and other foods with added sugar.  Full-fat dairy products.  Do not salt foods before eating.  Try to eat at least 2 vegetarian meals each week.  Eat more home-cooked food and less restaurant, buffet, and fast food.  When eating at a restaurant, ask that your food be prepared with less salt or no salt, if possible. What foods are recommended? The items listed may not be a complete list. Talk with your dietitian about what dietary choices are best for you. Grains  Whole-grain or whole-wheat bread. Whole-grain or whole-wheat pasta. Brown rice. Orpah Cobbatmeal. Quinoa. Bulgur. Whole-grain and low-sodium cereals. Pita bread. Low-fat, low-sodium crackers. Whole-wheat flour tortillas. Vegetables  Fresh or frozen vegetables (raw, steamed, roasted, or grilled). Low-sodium or reduced-sodium tomato and  vegetable juice. Low-sodium or reduced-sodium tomato sauce and tomato paste. Low-sodium or reduced-sodium canned vegetables. Fruits  All fresh, dried, or frozen fruit. Canned fruit in natural juice (without added sugar). Meat and other protein foods  Skinless chicken or Malaw[place]urkey. Ground chicken or [place]urkey. Pork with fat trimmed off. Fish and seafood. Egg whites. Dried beans, peas, or lentils. Unsalted nuts, nut butters, and seeds. Unsalted canned beans. Lean cuts of beef with fat trimmed off. Low-sodium, lean deli meat. Dairy  Low-fat (1%) or fat-free (skim) milk. Fat-free, low-fat, or reduced-fat cheeses. Nonfat, low-sodium ricotta or cottage cheese. Low-fat or nonfat yogurt. Low-fat, low-sodium cheese. Fats and oils  Soft margarine without trans fats. Vegetable oil. Low-fat, reduced-fat, or light mayonnaise and salad dressings (reduced-sodium). Canola, safflower, olive, soybean, and sunflower oils. Avocado. Seasoning and other foods  Herbs. Spices. Seasoning mixes without salt. Unsalted popcorn and pretzels. Fat-free sweets. What foods are not recommended? The items listed may not be a complete list. Talk with your dietitian about what dietary choices are best for you. Grains  Baked goods made with fat, such as croissants, muffins, or some breads. Dry pasta or rice meal packs. Vegetables  Creamed or fried vegetables. Vegetables in a cheese sauce. Regular canned vegetables (not low-sodium or reduced-sodium). Regular canned tomato sauce and paste (not low-sodium or reduced-sodium). Regular tomato and vegetable juice (not low-sodium or reduced-sodium). Rosita FirePickles. Olives. Fruits  Canned fruit in a light or heavy syrup. Fried fruit. Fruit in cream or butter sauce. Meat and other protein foods  Fatty cuts of meat. Ribs. Fried meat. Tomasa BlaseBacon. Sausage. Bologna and other processed lunch meats. Salami. Fatback. Hotdogs. Bratwurst. Salted nuts and seeds. Canned beans with added salt. Canned or smoked fish. Whole  eggs or egg yolks. Chicken or Mal[place]urkey with skin. Dairy  Whole or 2% milk, cream, and half-and-half. Whole or full-fat cream cheese. Whole-fat or sweetened yogurt. Full-fat cheese. Nondairy creamers. Whipped toppings. Processed cheese and cheese spreads. Fats and oils  Butter. Stick margarine. Lard. Shortening. Ghee. Bacon fat. Tropical oils, such as coconut, palm kernel, or palm oil. Seasoning and other foods  Salted popcorn and pretzels. Onion salt, garlic salt, seasoned salt, table salt, and sea salt. Worcestershire sauce. Tartar sauce. Barbecue sauce. Teriyaki sauce. Soy sauce, including reduced-sodium. Steak sauce. Canned and packaged gravies. Fish sauce. Oyster sauce. Cocktail sauce. Horseradish that you find on the shelf. Ketchup. Mustard. Meat flavorings and tenderizers. Bouillon cubes. Hot sauce and Tabasco sauce. Premade or packaged marinades. Premade or packaged taco seasonings. Relishes. Regular salad dressings. Where to find more information:  National Heart, Lung, and Blood Institute: PopSteam.iswww.nhlbi.nih.gov  American Heart Association: www.heart.org Summary  The DASH eating plan is a healthy eating plan that has been shown to reduce high blood pressure (hypertension). It may also reduce your risk for type 2 diabetes, heart disease, and stroke.  With the DASH eating plan, you should limit salt (sodium) intake to 2,300 mg a day. If you have hypertension, you may need to reduce your  sodium intake to 1,500 mg a day.  When on the DASH eating plan, aim to eat more fresh fruits and vegetables, whole grains, lean proteins, low-fat dairy, and heart-healthy fats.  Work with your health care provider or diet and nutrition specialist (dietitian) to adjust your eating plan to your individual calorie needs. This information is not intended to replace advice given to you by your health care provider. Make sure you discuss any questions you have with your health care provider. Document Released:  05/08/2011 Document Revised: 05/12/2016 Document Reviewed: 05/12/2016 Elsevier Interactive Patient Education  2017 Elsevier Inc.  Diabetes and Foot Care Diabetes may cause you to have problems because of poor blood supply (circulation) to your feet and legs. This may cause the skin on your feet to become thinner, break easier, and heal more slowly. Your skin may become dry, and the skin may peel and crack. You may also have nerve damage in your legs and feet causing decreased feeling in them. You may not notice minor injuries to your feet that could lead to infections or more serious problems. Taking care of your feet is one of the most important things you can do for yourself. Follow these instructions at home:  Wear shoes at all times, even in the house. Do not go barefoot. Bare feet are easily injured.  Check your feet daily for blisters, cuts, and redness. If you cannot see the bottom of your feet, use a mirror or ask someone for help.  Wash your feet with warm water (do not use hot water) and mild soap. Then pat your feet and the areas between your toes until they are completely dry. Do not soak your feet as this can dry your skin.  Apply a moisturizing lotion or petroleum jelly (that does not contain alcohol and is unscented) to the skin on your feet and to dry, brittle toenails. Do not apply lotion between your toes.  Trim your toenails straight across. Do not dig under them or around the cuticle. File the edges of your nails with an emery board or nail file.  Do not cut corns or calluses or try to remove them with medicine.  Wear clean socks or stockings every day. Make sure they are not too tight. Do not wear knee-high stockings since they may decrease blood flow to your legs.  Wear shoes that fit properly and have enough cushioning. To break in new shoes, wear them for just a few hours a day. This prevents you from injuring your feet. Always look in your shoes before you put them on  to be sure there are no objects inside.  Do not cross your legs. This may decrease the blood flow to your feet.  If you find a minor scrape, cut, or break in the skin on your feet, keep it and the skin around it clean and dry. These areas may be cleansed with mild soap and water. Do not cleanse the area with peroxide, alcohol, or iodine.  When you remove an adhesive bandage, be sure not to damage the skin around it.  If you have a wound, look at it several times a day to make sure it is healing.  Do not use heating pads or hot water bottles. They may burn your skin. If you have lost feeling in your feet or legs, you may not know it is happening until it is too late.  Make sure your health care provider performs a complete foot exam at least annually or  more often if you have foot problems. Report any cuts, sores, or bruises to your health care provider immediately. Contact a health care provider if:  You have an injury that is not healing.  You have cuts or breaks in the skin.  You have an ingrown nail.  You notice redness on your legs or feet.  You feel burning or tingling in your legs or feet.  You have pain or cramps in your legs and feet.  Your legs or feet are numb.  Your feet always feel cold. Get help right away if:  There is increasing redness, swelling, or pain in or around a wound.  There is a red line that goes up your leg.  Pus is coming from a wound.  You develop a fever or as directed by your health care provider.  You notice a bad smell coming from an ulcer or wound. This information is not intended to replace advice given to you by your health care provider. Make sure you discuss any questions you have with your health care provider. Document Released: 05/16/2000 Document Revised: 10/25/2015 Document Reviewed: 10/26/2012 Elsevier Interactive Patient Education  2017 Elsevier Inc.  Diabetes Mellitus and Exercise Exercising regularly is important for your  overall health, especially when you have diabetes (diabetes mellitus). Exercising is not only about losing weight. It has many health benefits, such as increasing muscle strength and bone density and reducing body fat and stress. This leads to improved fitness, flexibility, and endurance, all of which result in better overall health. Exercise has additional benefits for people with diabetes, including:  Reducing appetite.  Helping to lower and control blood glucose.  Lowering blood pressure.  Helping to control amounts of fatty substances (lipids) in the blood, such as cholesterol and triglycerides.  Helping the body to respond better to insulin (improving insulin sensitivity).  Reducing how much insulin the body needs.  Decreasing the risk for heart disease by:  Lowering cholesterol and triglyceride levels.  Increasing the levels of good cholesterol.  Lowering blood glucose levels. What is my activity plan? Your health care provider or certified diabetes educator can help you make a plan for the type and frequency of exercise (activity plan) that works for you. Make sure that you:  Do at least 150 minutes of moderate-intensity or vigorous-intensity exercise each week. This could be brisk walking, biking, or water aerobics.  Do stretching and strength exercises, such as yoga or weightlifting, at least 2 times a week.  Spread out your activity over at least 3 days of the week.  Get some form of physical activity every day.  Do not go more than 2 days in a row without some kind of physical activity.  Avoid being inactive for more than 90 minutes at a time. Take frequent breaks to walk or stretch.  Choose a type of exercise or activity that you enjoy, and set realistic goals.  Start slowly, and gradually increase the intensity of your exercise over time. What do I need to know about managing my diabetes?  Check your blood glucose before and after exercising.  If your blood  glucose is higher than 240 mg/dL (81.1 mmol/L) before you exercise, check your urine for ketones. If you have ketones in your urine, do not exercise until your blood glucose returns to normal.  Know the symptoms of low blood glucose (hypoglycemia) and how to treat it. Your risk for hypoglycemia increases during and after exercise. Common symptoms of hypoglycemia can include:  Hunger.  Anxiety.  Sweating and feeling clammy.  Confusion.  Dizziness or feeling light-headed.  Increased heart rate or palpitations.  Blurry vision.  Tingling or numbness around the mouth, lips, or tongue.  Tremors or shakes.  Irritability.  Keep a rapid-acting carbohydrate snack available before, during, and after exercise to help prevent or treat hypoglycemia.  Avoid injecting insulin into areas of the body that are going to be exercised. For example, avoid injecting insulin into:  The arms, when playing tennis.  The legs, when jogging.  Keep records of your exercise habits. Doing this can help you and your health care provider adjust your diabetes management plan as needed. Write down:  Food that you eat before and after you exercise.  Blood glucose levels before and after you exercise.  The type and amount of exercise you have done.  When your insulin is expected to peak, if you use insulin. Avoid exercising at times when your insulin is peaking.  When you start a new exercise or activity, work with your health care provider to make sure the activity is safe for you, and to adjust your insulin, medicines, or food intake as needed.  Drink plenty of water while you exercise to prevent dehydration or heat stroke. Drink enough fluid to keep your urine clear or pale yellow. This information is not intended to replace advice given to you by your health care provider. Make sure you discuss any questions you have with your health care provider. Document Released: 08/09/2003 Document Revised:  12/07/2015 Document Reviewed: 10/29/2015 Elsevier Interactive Patient Education  2017 [employer].

## 2016-10-01 LAB — HIV ANTIBODY (ROUTINE TESTING W REFLEX): HIV: NONREACTIVE

## 2016-11-25 ENCOUNTER — Telehealth: Payer: Self-pay

## 2016-11-25 DIAGNOSIS — E1149 Type 2 diabetes mellitus with other diabetic neurological complication: Secondary | ICD-10-CM

## 2016-11-25 DIAGNOSIS — E559 Vitamin D deficiency, unspecified: Secondary | ICD-10-CM

## 2016-11-25 DIAGNOSIS — E1122 Type 2 diabetes mellitus with diabetic chronic kidney disease: Secondary | ICD-10-CM

## 2016-11-25 DIAGNOSIS — F329 Major depressive disorder, single episode, unspecified: Secondary | ICD-10-CM

## 2016-11-25 DIAGNOSIS — F32A Depression, unspecified: Secondary | ICD-10-CM

## 2016-11-25 DIAGNOSIS — I10 Essential (primary) hypertension: Secondary | ICD-10-CM

## 2016-11-25 MED ORDER — HYDROCHLOROTHIAZIDE 25 MG PO TABS: 25.0000 mg | ORAL_TABLET | Freq: Every day | ORAL | 1 refills | Status: DC

## 2016-11-25 MED ORDER — METFORMIN HCL 500 MG PO TABS: 1000.0000 mg | ORAL_TABLET | Freq: Two times a day (BID) | ORAL | 1 refills | Status: DC

## 2016-11-25 MED ORDER — DULOXETINE HCL 20 MG PO CPEP: 20.0000 mg | ORAL_CAPSULE | Freq: Every day | ORAL | 1 refills | Status: DC

## 2016-11-25 MED ORDER — GABAPENTIN 300 MG PO CAPS: 300.0000 mg | ORAL_CAPSULE | Freq: Four times a day (QID) | ORAL | 1 refills | Status: DC

## 2016-11-25 MED ORDER — ERGOCALCIFEROL 1.25 MG (50000 UT) PO CAPS
50000.0000 [IU] | ORAL_CAPSULE | ORAL | 4 refills | Status: AC
Start: 2016-11-25 — End: ?

## 2016-11-25 MED FILL — GABAPENTIN 300 MG CAPSULE: 300 | 30 days supply | Qty: 120 | Fill #0

## 2016-11-25 MED FILL — HYDROCHLOROTHIAZIDE 25 MG T: 25 | 30 days supply | Qty: 30 | Fill #0

## 2016-11-25 MED FILL — DULoxetine HCL 20 MG CPEP: 20 | 30 days supply | Qty: 30 | Fill #0

## 2016-11-25 MED FILL — VIT D2 1.25 MG (50,000 UNIT: 1.25 MG | 28 days supply | Qty: 4 | Fill #0

## 2016-11-25 MED FILL — ?METFORMIN HCL 500MG TABLET: 500 | 30 days supply | Qty: 120 | Fill #0

## 2016-11-25 NOTE — Telephone Encounter (Signed)
[facility] Thanks!  

## 2016-11-26 MED FILL — !LANTUS 100 UNITS/ML VIAL: 100 | 28 days supply | Qty: 10 | Fill #0

## 2016-12-01 ENCOUNTER — Other Ambulatory Visit: Payer: Self-pay | Admitting: *Deleted

## 2016-12-01 MED ORDER — INSULIN DETEMIR 100 UNIT/ML ~~LOC~~ SOLN: 30.0000 [IU] | Freq: Every day | SUBCUTANEOUS | 11 refills | Status: DC

## 2016-12-01 NOTE — Telephone Encounter (Signed)
Prescribed until PASS is ready

## 2016-12-09 ENCOUNTER — Other Ambulatory Visit: Payer: Self-pay | Admitting: *Deleted

## 2016-12-09 DIAGNOSIS — F329 Major depressive disorder, single episode, unspecified: Secondary | ICD-10-CM

## 2016-12-09 DIAGNOSIS — F32A Depression, unspecified: Secondary | ICD-10-CM

## 2016-12-09 MED ORDER — DULOXETINE HCL 20 MG PO CPEP: 20.0000 mg | ORAL_CAPSULE | Freq: Every day | ORAL | 3 refills | Status: DC

## 2016-12-09 MED ORDER — INSULIN GLARGINE 100 UNIT/ML SOLOSTAR PEN: 30.0000 [IU] | PEN_INJECTOR | Freq: Every day | SUBCUTANEOUS | 3 refills | Status: DC

## 2016-12-09 NOTE — Telephone Encounter (Signed)
PRINTED FOR PASS PROGRAM 

## 2016-12-29 MED FILL — $LANTUS SOLOSTAR 100 UNITS/: 100 | 40 days supply | Qty: 12 | Fill #0

## 2016-12-31 ENCOUNTER — Ambulatory Visit: Payer: Self-pay | Admitting: Family Medicine

## 2017-01-01 ENCOUNTER — Ambulatory Visit: Payer: Self-pay | Admitting: Family Medicine

## 2017-01-08 ENCOUNTER — Encounter: Payer: Self-pay | Admitting: Family Medicine

## 2017-01-08 ENCOUNTER — Ambulatory Visit (INDEPENDENT_AMBULATORY_CARE_PROVIDER_SITE_OTHER): Payer: Self-pay | Admitting: Family Medicine

## 2017-01-08 VITALS — BP 152/96 | HR 91 | Temp 98.2°F | Resp 16 | Ht 71.0 in | Wt 147.0 lb

## 2017-01-08 DIAGNOSIS — F17200 Nicotine dependence, unspecified, uncomplicated: Secondary | ICD-10-CM

## 2017-01-08 DIAGNOSIS — I10 Essential (primary) hypertension: Secondary | ICD-10-CM

## 2017-01-08 DIAGNOSIS — E1122 Type 2 diabetes mellitus with diabetic chronic kidney disease: Secondary | ICD-10-CM

## 2017-01-08 DIAGNOSIS — E1149 Type 2 diabetes mellitus with other diabetic neurological complication: Secondary | ICD-10-CM

## 2017-01-08 LAB — COMPLETE METABOLIC PANEL WITH GFR
ALBUMIN: 4.2 g/dL (ref 3.6–5.1)
ALK PHOS: 65 U/L (ref 40–115)
ALT: 10 U/L (ref 9–46)
AST: 10 U/L (ref 10–35)
BILIRUBIN TOTAL: 0.4 mg/dL (ref 0.2–1.2)
BUN: 12 mg/dL (ref 7–25)
CO2: 25 mmol/L (ref 20–32)
Calcium: 9.6 mg/dL (ref 8.6–10.3)
Chloride: 103 mmol/L (ref 98–110)
Creat: 0.82 mg/dL (ref 0.70–1.33)
GLUCOSE: 219 mg/dL — AB (ref 65–99)
Potassium: 4.4 mmol/L (ref 3.5–5.3)
SODIUM: 139 mmol/L (ref 135–146)
TOTAL PROTEIN: 7.2 g/dL (ref 6.1–8.1)

## 2017-01-08 LAB — CBC WITH DIFFERENTIAL/PLATELET
BASOS ABS: 42 {cells}/uL (ref 0–200)
Basophils Relative: 1 %
EOS PCT: 9 %
Eosinophils Absolute: 378 cells/uL (ref 15–500)
HCT: 44.5 % (ref 38.5–50.0)
Hemoglobin: 14.9 g/dL (ref 13.2–17.1)
LYMPHS PCT: 31 %
Lymphs Abs: 1302 cells/uL (ref 850–3900)
MCH: 29.9 pg (ref 27.0–33.0)
MCHC: 33.5 g/dL (ref 32.0–36.0)
MCV: 89.2 fL (ref 80.0–100.0)
MPV: 10 fL (ref 7.5–12.5)
Monocytes Absolute: 336 cells/uL (ref 200–950)
Monocytes Relative: 8 %
NEUTROS ABS: 2142 {cells}/uL (ref 1500–7800)
Neutrophils Relative %: 51 %
Platelets: 214 10*3/uL (ref 140–400)
RBC: 4.99 MIL/uL (ref 4.20–5.80)
RDW: 13.9 % (ref 11.0–15.0)
WBC: 4.2 10*3/uL (ref 3.8–10.8)

## 2017-01-08 LAB — POCT URINALYSIS DIP (DEVICE)
Bilirubin Urine: NEGATIVE
Glucose, UA: 500 mg/dL — AB
HGB URINE DIPSTICK: NEGATIVE
Ketones, ur: NEGATIVE mg/dL
Leukocytes, UA: NEGATIVE
Nitrite: NEGATIVE
Protein, ur: 100 mg/dL — AB
SPECIFIC GRAVITY, URINE: 1.025 (ref 1.005–1.030)
UROBILINOGEN UA: 1 mg/dL (ref 0.0–1.0)
pH: 6 (ref 5.0–8.0)

## 2017-01-08 LAB — GLUCOSE, CAPILLARY: Glucose-Capillary: 221 mg/dL — ABNORMAL HIGH (ref 65–99)

## 2017-01-08 LAB — POCT GLYCOSYLATED HEMOGLOBIN (HGB A1C): Hemoglobin A1C: 10.9

## 2017-01-08 MED ORDER — LISINOPRIL 10 MG PO TABS: 10.0000 mg | ORAL_TABLET | Freq: Every day | ORAL | 3 refills | Status: DC

## 2017-01-08 MED ORDER — INSULIN GLARGINE 100 UNIT/ML SOLOSTAR PEN: 30.0000 [IU] | PEN_INJECTOR | Freq: Every day | SUBCUTANEOUS | 99 refills | Status: DC

## 2017-01-08 MED ORDER — GLUCOSE BLOOD VI STRP: ORAL_STRIP | 12 refills | Status: DC

## 2017-01-08 MED ORDER — HYDROCHLOROTHIAZIDE 25 MG PO TABS: 25.0000 mg | ORAL_TABLET | Freq: Every day | ORAL | 1 refills | Status: DC

## 2017-01-08 MED ORDER — GABAPENTIN 300 MG PO CAPS: 300.0000 mg | ORAL_CAPSULE | Freq: Four times a day (QID) | ORAL | 1 refills | Status: DC

## 2017-01-08 MED ORDER — "INSULIN SYRINGE-NEEDLE U-100 31G X 5/16"" 0.3 ML MISC": 1.0000 | Freq: Three times a day (TID) | 99 refills | Status: AC

## 2017-01-08 MED ORDER — METFORMIN HCL 1000 MG PO TABS: 1000.0000 mg | ORAL_TABLET | Freq: Two times a day (BID) | ORAL | 1 refills | Status: DC

## 2017-01-08 MED FILL — ?METFORMIN HCL 1,000 MG TAB: 1000 | 30 days supply | Qty: 60 | Fill #0

## 2017-01-08 MED FILL — TRUE METRIX TEST STRIP: 25 days supply | Qty: 100 | Fill #0

## 2017-01-08 MED FILL — GABAPENTIN 300 MG CAPSULE: 300 | 30 days supply | Qty: 120 | Fill #0

## 2017-01-08 MED FILL — HYDROCHLOROTHIAZIDE 25 MG T: 25 | 30 days supply | Qty: 30 | Fill #0

## 2017-01-08 MED FILL — TRUEPLUS SYR 0.5ML 31GX5/16: 31G X 5/16" | 25 days supply | Qty: 100 | Fill #0

## 2017-01-08 MED FILL — LISINOPRIL 10 MG TABLET: 10 | 30 days supply | Qty: 30 | Fill #0

## 2017-01-08 NOTE — Patient Instructions (Signed)
Will resume all home medications.  Please take all medications consistently  Lantus 30 units at bedtime.

## 2017-01-08 NOTE — Progress Notes (Signed)
Subjective:    Patient ID: Stephen Bird, male    DOB: 11-13-1960, 56 y.o.   MRN: [id number]  [name], a 56 year old male with a history of uncontrolled DMII and hypertension present for a follow up of chronic conditions. [name] says that he has not been taking his medications consistently. He has been out of medications for greater than 2 weeks. He was unable to make previously scheduled appointment duet to transportation constraints. Patient resides in a shelter in [place], [place]. He says that he has been adjusting Lantus at bedtime whenever blood sugars are increased, he typically takes more.   Hypertension  The problem has been gradually improving since onset. The problem is uncontrolled. Pertinent negatives include no anxiety, blurred vision, chest pain, headaches, malaise/fatigue, neck pain, orthopnea, palpitations, peripheral edema, PND, shortness of breath or sweats. There are no associated agents to hypertension. Risk factors for coronary artery disease include diabetes mellitus and dyslipidemia. Past treatments include calcium channel blockers. The current treatment provides mild improvement. Compliance problems include diet, medication cost, exercise and psychosocial issues.  There is no history of angina, kidney disease, CAD/MI, CVA, heart failure, left ventricular hypertrophy, PVD or retinopathy. There is no history of chronic renal disease, coarctation of the aorta, hyperaldosteronism, hypercortisolism, hyperparathyroidism, a hypertension causing med, pheochromocytoma, renovascular disease, sleep apnea or a thyroid problem.  Diabetes  He presents for his follow-up diabetic visit. He has type 2 diabetes mellitus. His disease course has been fluctuating ([name] says that he has not taken Lantus in greater than 1 week. ). Pertinent negatives for hypoglycemia include no dizziness, headaches or sweats. Pertinent negatives for diabetes include no blurred vision, no chest pain, no  fatigue, no foot ulcerations, no polydipsia, no polyphagia and no polyuria. Pertinent negatives for hypoglycemia complications include no hospitalization. Symptoms are stable. Diabetic complications include nephropathy. Pertinent negatives for diabetic complications include no CVA, PVD or retinopathy. Risk factors for coronary artery disease include dyslipidemia and hypertension. Current diabetic treatment includes insulin injections and oral agent (monotherapy). He is compliant with treatment some of the time. He is following a generally unhealthy diet. When asked about meal planning, he reported none. He has not had a previous visit with a dietitian. Home blood sugar record trend: Infrequently monitoring blood sugar. An ACE inhibitor/angiotensin II receptor blocker is being taken. He does not see a podiatrist.Eye exam is current.  medications.   Past Medical History:  Diagnosis Date  . Alcohol abuse   . Diabetes mellitus without complication (HCC)   . Drug abuse   . Hypertension   . Sickle cell disease [facility])    Social History   Social History Narrative  . No narrative on file   Immunization History  Administered Date(s) Administered  . Influenza Split 04/11/2015  . Influenza,inj,Quad PF,36+ Mos 04/11/2016  . Pneumococcal Polysaccharide-23 06/09/2015  . Tdap 09/30/2016  No Known Allergies   Review of Systems  Constitutional: Positive for unexpected weight change (weight loss). Negative for fatigue, fever and malaise/fatigue.  Eyes: Positive for photophobia. Negative for blurred vision.  Respiratory: Negative.  Negative for shortness of breath.   Cardiovascular: Negative.  Negative for chest pain, palpitations, orthopnea and PND.  Gastrointestinal: Negative.   Endocrine: Negative for polydipsia, polyphagia and polyuria.  Genitourinary: Negative.   Musculoskeletal: Positive for back pain and myalgias. Negative for neck pain.  Skin: Negative.   Allergic/Immunologic: Negative.   Negative for immunocompromised state.  Neurological: Positive for numbness. Negative for dizziness and  headaches.  Hematological: Negative.   Psychiatric/Behavioral: Negative for suicidal ideas.       Objective:   Physical Exam  Constitutional: He is oriented to person, place, and time. He appears well-developed and well-nourished.  HENT:  Head: Normocephalic and atraumatic.  Right Ear: External ear normal.  Left Ear: External ear normal.  Mouth/Throat: Oropharynx is clear and moist.  Eyes: Pupils are equal, round, and reactive to light. Conjunctivae are normal.  Neck: Normal range of motion. Neck supple.  Cardiovascular: Normal rate, regular rhythm, normal heart sounds and intact distal pulses.   Pulmonary/Chest: Effort normal and breath sounds normal.  Abdominal: Soft. Bowel sounds are normal.  Neurological: He is alert and oriented to person, place, and time. He has normal strength and normal reflexes. He displays a negative Romberg sign.  Skin: Skin is warm and dry.  Psychiatric: He expresses no homicidal and no suicidal ideation.       BP (!) 152/96 (BP Location: Left Arm, Patient Position: Sitting, Cuff Size: Normal) Comment: manually  Pulse 91   Temp 98.2 F (36.8 C) (Oral)   Resp 16   Ht 5\' 11"  (1.803 m)   Wt 147 lb (66.7 kg)   SpO2 100%   BMI 20.50 kg/m  Assessment & Plan:  1. Type 2 diabetes mellitus with chronic kidney disease, without  Hemoglobin a1c is 10.9. There have been some compliance issues due to previous living situation at homeless shelter. I have discussed with him the great importance of following the treatment plan exactly as directed in order to achieve a good medical outcome.  - POCT urinalysis dip (device) - Glucose, capillary - HgB A1c - metFORMIN (GLUCOPHAGE) 1000 MG tablet; Take 1 tablet (1,000 mg total) by mouth 2 (two) times daily with a meal.  Dispense: 180 tablet; Refill: 1 - Insulin Glargine (LANTUS) 100 UNIT/ML Solostar Pen; Inject 30  Units into the skin daily at 10 pm.  Dispense: 27 mL; Refill: prn - glucose blood (TRUE METRIX BLOOD GLUCOSE TEST) test strip; Use as instructed  Dispense: 100 each; Refill: 12 - Insulin Syringe-Needle U-100 (B-D INS SYR ULTRAFINE .3CC/31G) 31G X 5/16" 0.3 ML MISC; 1 each by Does not apply route 4 (four) times daily -  before meals and at bedtime.  Dispense: 100 each; Refill: prn - COMPLETE METABOLIC PANEL WITH GFR - CBC with Differential  2. Essential hypertension Blood pressure is above goal. Will restart antihypertensive medications.  - POCT urinalysis dip (device) - lisinopril (PRINIVIL,ZESTRIL) 10 MG tablet; Take 1 tablet (10 mg total) by mouth daily.  Dispense: 90 tablet; Refill: 3 - hydrochlorothiazide (HYDRODIURIL) 25 MG tablet; Take 1 tablet (25 mg total) by mouth daily at 2 PM.  Dispense: 90 tablet; Refill: 1  3. Other diabetic neurological complication associated with type 2 diabetes mellitus (HCC) - gabapentin (NEURONTIN) 300 MG capsule; Take 1 capsule (300 mg total) by mouth 4 (four) times daily.  Dispense: 120 capsule; Refill: 1  4. Tobacco dependence Smoking cessation instruction/counseling given:  counseled patient on the dangers of tobacco use, advised patient to stop smoking, and reviewed strategies to maximize success    RTC: 1 month for DMII and hypertension  Nolon NationsLaChina Moore Veda Arrellano  MSN, FNP-C [facility] [address]  [place], [place] [id number] [phone]

## 2017-02-09 ENCOUNTER — Ambulatory Visit: Payer: Self-pay | Admitting: Family Medicine

## 2017-02-13 ENCOUNTER — Ambulatory Visit: Payer: Self-pay | Admitting: Family Medicine

## 2017-03-02 ENCOUNTER — Encounter: Payer: Self-pay | Admitting: Family Medicine

## 2017-03-02 ENCOUNTER — Ambulatory Visit (INDEPENDENT_AMBULATORY_CARE_PROVIDER_SITE_OTHER): Payer: Self-pay | Admitting: Family Medicine

## 2017-03-02 VITALS — BP 136/90 | HR 110 | Temp 98.6°F | Resp 16 | Ht 71.0 in | Wt 152.0 lb

## 2017-03-02 DIAGNOSIS — N529 Male erectile dysfunction, unspecified: Secondary | ICD-10-CM

## 2017-03-02 DIAGNOSIS — E1122 Type 2 diabetes mellitus with diabetic chronic kidney disease: Secondary | ICD-10-CM

## 2017-03-02 DIAGNOSIS — I10 Essential (primary) hypertension: Secondary | ICD-10-CM

## 2017-03-02 DIAGNOSIS — F17200 Nicotine dependence, unspecified, uncomplicated: Secondary | ICD-10-CM

## 2017-03-02 DIAGNOSIS — E1149 Type 2 diabetes mellitus with other diabetic neurological complication: Secondary | ICD-10-CM

## 2017-03-02 LAB — POCT URINALYSIS DIP (DEVICE)
BILIRUBIN URINE: NEGATIVE
Hgb urine dipstick: NEGATIVE
KETONES UR: NEGATIVE mg/dL
LEUKOCYTES UA: NEGATIVE
Nitrite: NEGATIVE
Protein, ur: NEGATIVE mg/dL
SPECIFIC GRAVITY, URINE: 1.025 (ref 1.005–1.030)
Urobilinogen, UA: 1 mg/dL (ref 0.0–1.0)
pH: 5.5 (ref 5.0–8.0)

## 2017-03-02 LAB — POCT GLYCOSYLATED HEMOGLOBIN (HGB A1C): HEMOGLOBIN A1C: 9.5

## 2017-03-02 LAB — GLUCOSE, CAPILLARY: Glucose-Capillary: 255 mg/dL — ABNORMAL HIGH (ref 65–99)

## 2017-03-02 MED ORDER — LISINOPRIL 5 MG PO TABS: 5.0000 mg | ORAL_TABLET | Freq: Every day | ORAL | 5 refills | Status: DC

## 2017-03-02 MED ORDER — LISINOPRIL 5 MG PO TABS: 10.0000 mg | ORAL_TABLET | Freq: Every day | ORAL | 5 refills | Status: DC

## 2017-03-02 MED ORDER — GABAPENTIN 300 MG PO CAPS: 300.0000 mg | ORAL_CAPSULE | Freq: Four times a day (QID) | ORAL | 3 refills | Status: DC

## 2017-03-02 MED ORDER — INSULIN PEN NEEDLE 29G X 12.7MM MISC: 1.0000 | Freq: Three times a day (TID) | 99 refills | Status: DC

## 2017-03-02 MED ORDER — SILDENAFIL CITRATE 20 MG PO TABS: 20.0000 mg | ORAL_TABLET | Freq: Every day | ORAL | 0 refills | Status: DC | PRN

## 2017-03-02 MED ORDER — INSULIN GLARGINE 100 UNIT/ML SOLOSTAR PEN: 20.0000 [IU] | PEN_INJECTOR | Freq: Every day | SUBCUTANEOUS | 99 refills | Status: DC

## 2017-03-02 NOTE — Progress Notes (Signed)
Subjective:    Patient ID: Stephen Bird, male    DOB: 09/23/1960, 56 y.o.   MRN: [id number]  Stephen Bird, a 56 year old male with a history of uncontrolled DMII and hypertension present for a follow up of chronic conditions. Mr. Nephew says that he has been more consistent with taking home medications.  Patient resides in a shelter in [place], [place] and says that he cannot maintain a carbohydrate modified diet due to poor food choices.    Hypertension  The problem has been gradually improving since onset. The problem is uncontrolled. Pertinent negatives include no anxiety, blurred vision, chest pain, headaches, malaise/fatigue, neck pain, orthopnea, palpitations, peripheral edema, PND, shortness of breath or sweats. There are no associated agents to hypertension. Risk factors for coronary artery disease include diabetes mellitus and dyslipidemia. Past treatments include calcium channel blockers. The current treatment provides mild improvement. Compliance problems include diet, medication cost, exercise and psychosocial issues.  There is no history of angina, kidney disease, CAD/MI, CVA, heart failure, left ventricular hypertrophy, PVD or retinopathy. There is no history of chronic renal disease, coarctation of the aorta, hyperaldosteronism, hypercortisolism, hyperparathyroidism, a hypertension causing med, pheochromocytoma, renovascular disease, sleep apnea or a thyroid problem.  Diabetes  He presents for his follow-up diabetic visit. He has type 2 diabetes mellitus. His disease course has been fluctuating ( ). Pertinent negatives for hypoglycemia include no dizziness, headaches, nervousness/anxiousness or sweats. Pertinent negatives for diabetes include no blurred vision, no chest pain, no fatigue, no foot ulcerations, no polydipsia, no polyphagia and no polyuria. Pertinent negatives for hypoglycemia complications include no hospitalization. Symptoms are stable. Pertinent negatives for diabetic  complications include no CVA, PVD or retinopathy. Risk factors for coronary artery disease include dyslipidemia and hypertension. Current diabetic treatment includes insulin injections and oral agent (monotherapy). He is compliant with treatment some of the time. He is following a generally unhealthy diet. When asked about meal planning, he reported none. He has not had a previous visit with a dietitian. Home blood sugar record trend: Infrequently monitoring blood sugar. An ACE inhibitor/angiotensin II receptor blocker is being taken. He does not see a podiatrist.Eye exam is current.  Erectile Dysfunction  This is a recurrent problem. The current episode started more than 1 month ago. The nature of his difficulty is maintaining erection and achieving erection. He reports no anxiety. Irritative symptoms do not include frequency, nocturia or urgency. Obstructive symptoms do not include dribbling, an intermittent stream or straining. Risk factors include tobacco use, diabetes mellitus and hypertension.  medications.   Past Medical History:  Diagnosis Date  . Alcohol abuse   . Diabetes mellitus without complication (HCC)   . Drug abuse (HCC)   . Hypertension   . Sickle cell disease [facility])    Social History   Social History Narrative  . No narrative on file   Immunization History  Administered Date(s) Administered  . Influenza Split 04/11/2015  . Influenza,inj,Quad PF,6+ Mos 04/11/2016  . Pneumococcal Polysaccharide-23 06/09/2015  . Tdap 09/30/2016  No Known Allergies   Review of Systems  Constitutional: Positive for unexpected weight change. Negative for fatigue, fever and malaise/fatigue.  Eyes: Positive for photophobia. Negative for blurred vision.  Respiratory: Negative.  Negative for shortness of breath.   Cardiovascular: Negative.  Negative for chest pain, palpitations, orthopnea and PND.  Gastrointestinal: Negative.   Endocrine: Negative for polydipsia, polyphagia and polyuria.   Genitourinary: Negative.  Negative for frequency, nocturia and urgency.  Musculoskeletal: Negative for neck pain.  Skin: Negative.   Allergic/Immunologic: Negative.  Negative for immunocompromised state.  Neurological: Positive for numbness. Negative for dizziness and headaches.  Hematological: Negative.   Psychiatric/Behavioral: Negative for suicidal ideas. The patient is not nervous/anxious.        Objective:   Physical Exam  Constitutional: He is oriented to person, place, and time. He appears well-developed and well-nourished.  HENT:  Head: Normocephalic and atraumatic.  Right Ear: External ear normal.  Left Ear: External ear normal.  Mouth/Throat: Oropharynx is clear and moist.  Eyes: Pupils are equal, round, and reactive to light. Conjunctivae are normal.  Neck: Normal range of motion. Neck supple.  Cardiovascular: Normal rate, regular rhythm, normal heart sounds and intact distal pulses.   Pulmonary/Chest: Effort normal and breath sounds normal.  Abdominal: Soft. Bowel sounds are normal.  Genitourinary: Rectal exam shows guaiac negative stool. Prostate is enlarged.  Neurological: He is alert and oriented to person, place, and time. He has normal strength and normal reflexes. He displays a negative Romberg sign.  Skin: Skin is warm and dry.  Psychiatric: He expresses no homicidal and no suicidal ideation.       BP 136/90 (BP Location: Left Arm, Patient Position: Sitting, Cuff Size: Normal) Comment: manually  Pulse (!) 110   Temp 98.6 F (37 C) (Oral)   Resp 16   Ht  (1.803 m)   Wt 152 lb (68.9 kg)   SpO2 100%   BMI 21.20 kg/m  Assessment & Plan:  1. Type 2 diabetes mellitus with chronic kidney disease, without  Hemoglobin a1c has decreased to 9.5. Will lower Lantus to 20 units at bedtime to prevent hypoglycemia. Will eat a high protein, low carb snack at bedtime.  - HgB A1c - Insulin Pen Needle 29G X 12.7MM MISC; 1 each by Does not apply route 4 (four) times  daily -  before meals and at bedtime.  Dispense: 100 each; Refill: prn - Insulin Glargine (LANTUS) 100 UNIT/ML Solostar Pen; Inject 20 Units into the skin daily at 10 pm.  Dispense: 27 mL; Refill: prn 2. Essential hypertension Will continue Lisinopril at 5 mg daily Hold hydrocholrothiazide due to episodes of hypotension.  - lisinopril (PRINIVIL,ZESTRIL) 5 MG tablet; Take 1 tablets (5 mg total) by mouth daily.  Dispense: 30 tablet; Refill: 5 - BASIC METABOLIC PANEL WITH GFR  3. Other diabetic neurological complication associated with type 2 diabetes mellitus (HCC)  - gabapentin (NEURONTIN) 300 MG capsule; Take 1 capsule (300 mg total) by mouth 4 (four) times daily.  Dispense: 120 capsule; Refill: 3  4. Tobacco dependence Smoking cessation instruction/counseling given:  counseled patient on the dangers of tobacco use, advised patient to stop smoking, and reviewed strategies to maximize success  5. Erectile dysfunction, unspecified erectile dysfunction type The patient desires Viagra to treat his erectile dysfunction. History and physical exam has not disclosed any obvious treatable cause of this complaint. He is informed that Viagra is sometimes not covered by insurance. It is available on a fee-for-service cost basis, and is relatively expensive. He can start with 20 mg dose. The method of use 1 hour prior to anticipated intercourse is explained. He should not use any more than one tablet in a 24 hour period. The side effects of possible headache, flushing, dyspepsia and transient changes in vision have been explained.   The patient is not taking nitrates, and denies he has access to nitrates in any form at any time. I have counseled him that taking Viagra with nitrates of  any form can cause death. Additionally, Viagra serum concentrations can be increased by the following: cimetidine, erythromycin, itraconazole or ketoconazole. This patient does not take these drugs, but I have counseled him to  avoid Viagra if he does take any of these.  We have also discussed the fact that there have been some deaths in patients after taking Viagra, felt due to the exertion of intercourse rather than the drug itself. The patient is aware of this, and accepts whatever unknown degree of risk there is in this aspect.  Patient expressed understanding  - sildenafil (REVATIO) 20 MG tablet; Take 1 tablet (20 mg total) by mouth daily as needed.  Dispense: 10 tablet; Refill: 0 - PSA - IFOBT POC (occult bld, rslt in office); Future  RTC: 3 months for DMII and hypertension   Nolon Nations  MSN, FNP-C Patient [facility] [address] [place], [place] [address] [phone]

## 2017-03-02 NOTE — Patient Instructions (Addendum)
Your A1C goal is less than 7. Your fasting blood sugar  Upon awakening goal is between 110-140.  Your LDL  (bad cholesterol goal is less than 100 Blood pressure goal is <140/90.  Recommend a lowfat, low carbohydrate diet divided over 5-6 small meals, increase water intake to 6-8 glasses, and 150 minutes per week of cardiovascular exercise.   Take your medications as prescribed Make sure that you are familiar with each one of your medications and what they are used to treat.  If you are unsure of medications, please bring to follow up Will send referral for eye exam  Please keep your scheduled follow up appointment.    Continue to hold hydrochlorothiazide.  Will lower Lisinopril to 5 mg daily.  Continue medication, monitor blood pressure at home. Continue DASH diet. Reminder to go to the ER if any CP, SOB, nausea, dizziness, severe HA, changes vision/speech, left arm numbness and tingling and jaw pain.    The patient desires Viagra to treat his erectile dysfunction. History and physical exam has not disclosed any obvious treatable cause of this complaint. He is informed that Viagra is sometimes not covered by insurance. It is available on a fee-for-service cost basis, and is relatively expensive. He can start with 20 mg dose. The method of use 1 hour prior to anticipated intercourse is explained. He should not use any more than one tablet in a 24 hour period. The side effects of possible headache, flushing, dyspepsia and transient changes in vision have been explained.   The patient is not taking nitrates, and denies he has access to nitrates in any form at any time. I have counseled him that taking Viagra with nitrates of any form can cause death. Additionally, Viagra serum concentrations can be increased by the following: cimetidine, erythromycin, itraconazole or ketoconazole. This patient does not take these drugs, but I have counseled him to avoid Viagra if he does take any of these.  We  have also discussed the fact that there have been some deaths in patients after taking Viagra, felt due to the exertion of intercourse rather than the drug itself. The patient is aware of this, and accepts whatever unknown degree of risk there is in this aspect.

## 2017-03-03 ENCOUNTER — Other Ambulatory Visit: Payer: Self-pay

## 2017-03-03 DIAGNOSIS — E1122 Type 2 diabetes mellitus with diabetic chronic kidney disease: Secondary | ICD-10-CM

## 2017-03-03 DIAGNOSIS — I10 Essential (primary) hypertension: Secondary | ICD-10-CM

## 2017-03-03 DIAGNOSIS — E1149 Type 2 diabetes mellitus with other diabetic neurological complication: Secondary | ICD-10-CM

## 2017-03-03 DIAGNOSIS — N529 Male erectile dysfunction, unspecified: Secondary | ICD-10-CM

## 2017-03-03 LAB — BASIC METABOLIC PANEL WITH GFR
BUN: 12 mg/dL (ref 7–25)
CALCIUM: 9.4 mg/dL (ref 8.6–10.3)
CHLORIDE: 98 mmol/L (ref 98–110)
CO2: 26 mmol/L (ref 20–32)
Creat: 0.95 mg/dL (ref 0.70–1.33)
GFR, Est African American: 105 mL/min/{1.73_m2} (ref >=60)
GFR, Est Non African American: 90 mL/min/{1.73_m2} (ref >=60)
GLUCOSE: 249 mg/dL — AB (ref 65–99)
Potassium: 4.2 mmol/L (ref 3.5–5.3)
Sodium: 135 mmol/L (ref 135–146)

## 2017-03-03 LAB — EXTRA LAV TOP TUBE

## 2017-03-03 LAB — PSA: PSA: 0.6 ng/mL (ref <=4.0)

## 2017-03-03 MED ORDER — SILDENAFIL CITRATE 20 MG PO TABS: 20.0000 mg | ORAL_TABLET | Freq: Every day | ORAL | 0 refills | Status: AC | PRN

## 2017-03-03 MED ORDER — GABAPENTIN 300 MG PO CAPS: 300.0000 mg | ORAL_CAPSULE | Freq: Four times a day (QID) | ORAL | 3 refills | Status: DC

## 2017-03-03 MED ORDER — INSULIN GLARGINE 100 UNIT/ML SOLOSTAR PEN: 20.0000 [IU] | PEN_INJECTOR | Freq: Every day | SUBCUTANEOUS | 99 refills | Status: DC

## 2017-03-03 MED ORDER — INSULIN PEN NEEDLE 29G X 12.7MM MISC: 1.0000 | Freq: Three times a day (TID) | 99 refills | Status: DC

## 2017-03-03 MED ORDER — LISINOPRIL 5 MG PO TABS: 5.0000 mg | ORAL_TABLET | Freq: Every day | ORAL | 5 refills | Status: DC

## 2017-03-03 MED ORDER — METFORMIN HCL 1000 MG PO TABS: 1000.0000 mg | ORAL_TABLET | Freq: Two times a day (BID) | ORAL | 1 refills | Status: AC

## 2017-03-03 MED FILL — ?METFORMIN HCL 1,000 MG TAB: 1000 | 30 days supply | Qty: 60 | Fill #0

## 2017-03-03 MED FILL — SILDENAFIL 20 MG TABLET: 20 | 10 days supply | Qty: 10 | Fill #0

## 2017-03-03 MED FILL — TRUEPLUS PEN NDL 31GX5/16": 31 GX5/16" | 25 days supply | Qty: 100 | Fill #0

## 2017-03-03 MED FILL — TRUEPLUS PEN NDL 31GX5/16: 31 GX5/16" | 25 days supply | Qty: 100 | Fill #0

## 2017-03-03 MED FILL — GABAPENTIN 300 MG CAPSULE: 300 | 30 days supply | Qty: 120 | Fill #0

## 2017-03-03 MED FILL — LISINOPRIL 5 MG TAB: 5 | 30 days supply | Qty: 30 | Fill #0

## 2017-03-03 NOTE — Telephone Encounter (Signed)
Medications were sent to wrong pharmacy yesterday, I have corrected this and sent to  Correct pharmacy. Thanks!

## 2017-04-14 ENCOUNTER — Telehealth: Payer: Self-pay

## 2017-04-14 DIAGNOSIS — I10 Essential (primary) hypertension: Secondary | ICD-10-CM

## 2017-04-14 DIAGNOSIS — E1149 Type 2 diabetes mellitus with other diabetic neurological complication: Secondary | ICD-10-CM

## 2017-04-14 DIAGNOSIS — E1122 Type 2 diabetes mellitus with diabetic chronic kidney disease: Secondary | ICD-10-CM

## 2017-04-14 MED ORDER — LISINOPRIL 5 MG PO TABS: 5.0000 mg | ORAL_TABLET | Freq: Every day | ORAL | 5 refills | Status: DC

## 2017-04-14 MED ORDER — INSULIN PEN NEEDLE 29G X 12.7MM MISC: 1.0000 | Freq: Three times a day (TID) | 99 refills | Status: AC

## 2017-04-14 MED ORDER — INSULIN GLARGINE 100 UNIT/ML SOLOSTAR PEN
20.0000 [IU] | PEN_INJECTOR | Freq: Every day | SUBCUTANEOUS | 99 refills | Status: DC
Start: 2017-04-14 — End: 2017-06-10

## 2017-04-14 MED ORDER — GABAPENTIN 300 MG PO CAPS: 300.0000 mg | ORAL_CAPSULE | Freq: Four times a day (QID) | ORAL | 3 refills | Status: DC

## 2017-04-14 NOTE — Telephone Encounter (Signed)
Refills sent into pharmacy. Thanks!  

## 2017-04-17 MED FILL — $LANTUS SOLOSTAR 100 UNITS/: 100 | 30 days supply | Qty: 6 | Fill #0

## 2017-04-17 MED FILL — HYDROCHLOROTHIAZIDE 25 MG T: 25 | 30 days supply | Qty: 30 | Fill #1

## 2017-04-17 MED FILL — GABAPENTIN 300 MG CAPSULE: 300 | 30 days supply | Qty: 120 | Fill #1

## 2017-04-17 MED FILL — LISINOPRIL 5 MG TAB: 5 | 30 days supply | Qty: 30 | Fill #1

## 2017-04-28 ENCOUNTER — Telehealth: Payer: Self-pay

## 2017-04-28 NOTE — Telephone Encounter (Signed)
Called, no answer. Left a message for patient to call back. Thanks!  

## 2017-04-28 NOTE — Telephone Encounter (Signed)
-----   Message from Massie MaroonLachina M Hollis, [place]NP sent at 04/28/2017  5:49 AM EST ----- Regarding: lab results Please have patient contact the [facility] in [place], [phone] to establish care. Continue gabapentin as prescribed for neuropathy and discuss with provider when he establishes care. Please send all medications to [facility] for 30 days to support patient while he transitions to another clinic.   Thanks ----- Message ----- From: Loney HeringBatten, Nichole Keltner E, LPN Sent: [id number]/621311/26/2018  11:23 AM To: Massie MaroonLachina M Hollis, FNP  Did you know who in [place] you recommenced patient see for primary care? He says he can't make it to  and you had a collogue there you recommenced for him but he can't remember who it was? Please let me know and I will call patient back at number below. He is having problems with the Neuropathy pain. Thanks!   [phone]

## 2017-04-29 NOTE — Telephone Encounter (Signed)
Called no answer. Left a message for patient to return call. Thanks!  

## 2017-06-04 ENCOUNTER — Other Ambulatory Visit: Payer: Self-pay | Admitting: Family Medicine

## 2017-06-04 ENCOUNTER — Ambulatory Visit: Payer: Self-pay | Admitting: Family Medicine

## 2017-06-04 ENCOUNTER — Other Ambulatory Visit: Payer: Self-pay

## 2017-06-04 DIAGNOSIS — E1149 Type 2 diabetes mellitus with other diabetic neurological complication: Secondary | ICD-10-CM

## 2017-06-04 DIAGNOSIS — I10 Essential (primary) hypertension: Secondary | ICD-10-CM

## 2017-06-04 MED ORDER — GABAPENTIN 300 MG PO CAPS: 300.0000 mg | ORAL_CAPSULE | Freq: Four times a day (QID) | ORAL | 0 refills | Status: AC

## 2017-06-04 MED ORDER — LISINOPRIL 5 MG PO TABS: 5.0000 mg | ORAL_TABLET | Freq: Every day | ORAL | 0 refills | Status: AC

## 2017-06-04 MED FILL — GABAPENTIN 300 MG CAPSULE: 300 | 30 days supply | Qty: 120 | Fill #0

## 2017-06-04 MED FILL — LISINOPRIL 5 MG TAB: 5 | 30 days supply | Qty: 30 | Fill #0

## 2017-06-04 NOTE — Telephone Encounter (Signed)
Refills sent into pharmacy. Thanks!  

## 2017-06-10 ENCOUNTER — Ambulatory Visit (INDEPENDENT_AMBULATORY_CARE_PROVIDER_SITE_OTHER): Payer: Self-pay | Admitting: Family Medicine

## 2017-06-10 ENCOUNTER — Encounter: Payer: Self-pay | Admitting: Family Medicine

## 2017-06-10 VITALS — BP 140/86 | HR 92 | Temp 97.9°F | Resp 16 | Ht 71.0 in | Wt 157.0 lb

## 2017-06-10 DIAGNOSIS — E1122 Type 2 diabetes mellitus with diabetic chronic kidney disease: Secondary | ICD-10-CM

## 2017-06-10 DIAGNOSIS — E1140 Type 2 diabetes mellitus with diabetic neuropathy, unspecified: Secondary | ICD-10-CM

## 2017-06-10 DIAGNOSIS — L84 Corns and callosities: Secondary | ICD-10-CM

## 2017-06-10 DIAGNOSIS — I10 Essential (primary) hypertension: Secondary | ICD-10-CM

## 2017-06-10 DIAGNOSIS — G4701 Insomnia due to medical condition: Secondary | ICD-10-CM

## 2017-06-10 DIAGNOSIS — G629 Polyneuropathy, unspecified: Secondary | ICD-10-CM

## 2017-06-10 LAB — POCT GLYCOSYLATED HEMOGLOBIN (HGB A1C): Hemoglobin A1C: 9.5

## 2017-06-10 LAB — POCT URINALYSIS DIP (DEVICE)
Bilirubin Urine: NEGATIVE
Glucose, UA: 500 mg/dL — AB
Hgb urine dipstick: NEGATIVE
KETONES UR: NEGATIVE mg/dL
Leukocytes, UA: NEGATIVE
Nitrite: NEGATIVE
PROTEIN: NEGATIVE mg/dL
Urobilinogen, UA: 0.2 mg/dL (ref 0.0–1.0)
pH: 6 (ref 5.0–8.0)

## 2017-06-10 LAB — GLUCOSE, CAPILLARY: Glucose-Capillary: 180 mg/dL — ABNORMAL HIGH (ref 65–99)

## 2017-06-10 MED ORDER — TRAZODONE HCL 50 MG PO TABS: 50.0000 mg | ORAL_TABLET | Freq: Every evening | ORAL | 3 refills | Status: AC | PRN

## 2017-06-10 MED ORDER — INSULIN GLARGINE 100 UNIT/ML SOLOSTAR PEN: 20.0000 [IU] | PEN_INJECTOR | Freq: Every day | SUBCUTANEOUS | 3 refills | Status: AC

## 2017-06-10 MED ORDER — HYDROCHLOROTHIAZIDE 25 MG PO TABS: 12.5000 mg | ORAL_TABLET | Freq: Every day | ORAL | 3 refills | Status: AC

## 2017-06-10 MED ORDER — GLUCOSE BLOOD VI STRP: ORAL_STRIP | 12 refills | Status: AC

## 2017-06-10 NOTE — Progress Notes (Signed)
Patient ID: Tannen Vandezande, male    DOB: 1960/09/01, 57 y.o.   MRN: [id number]  PCP: [name], FNP  Chief Complaint  Patient presents with  . Shoulder Pain  . Peripheral Neuropathy    needs a referral for neuropathy to [place]   . Follow-up  . Hip Pain  . Back Pain  . Hypertension    Subjective:  HPI Patient seen on behalf of Lachina Hollins-FNP-C  Demontrez Rindfleisch is a 57 y.o. male with uncontrolled diabetes, hypertension, hx of substance abuse, chronic neuropathy, chronic pain, presents today requesting referrals to speciality services and with multiple complaints.  [place] resides in [place] has difficulty traveling here to [place] for primary care appointments. He has had difficulty establishing with a new provider as medicaid is his primary source of insurance. He is requesting a referral to neurology as his peripheral neuropathy is worsening to the extent of interfering with ADLs and sleep quality. He takes 2400 mg of Gabapentin in 2 divided doses daily without relief of pain. Pain is severe and is characterized as constant "stinging and sticking type pain."Pain is most pronounced in his feet and legs although occurs routinely in upper extremities. Diabetes remains uncontrolled. Last A1C 9.5. Reports monitoring blood sugar at least every other day, and obtains readings 150-200. He currently is inactive with exception of being physically active at his placed of employment. Shreyansh also suffers form hypertension. He doesn't routinely monitor his blood pressure. Take all medications consistently. Unfortunately he has been unsuccessful in quitting smoking. Denies chest pain, dizziness, headaches, shortness of breath, or cough. Social History   Socioeconomic History  . Marital status: Single    Spouse name: Not on file  . Number of children: Not on file  . Years of education: Not on file  . Highest education level: Not on file  Social Needs  . Financial resource strain: Not on file   . Food insecurity - worry: Not on file  . Food insecurity - inability: Not on file  . Transportation needs - medical: Not on file  . Transportation needs - non-medical: Not on file  Occupational History  . Not on file  Tobacco Use  . Smoking status: Current Every Day Smoker    Packs/day: 0.25    Types: Cigarettes  . Smokeless tobacco: Never Used  . Tobacco comment: less  Substance and Sexual Activity  . Alcohol use: No    Comment: patient states no alcohol since 08/31/2016.   . Drug use: No    Comment: HEROIN-DAILY( Patient denied use 09/30/2016)  . Sexual activity: Yes  Other Topics Concern  . Not on file  Social History Narrative  . Not on file    Family History  Family history unknown: Yes   Review of Systems  Constitutional: Negative.   HENT: Negative.   Respiratory: Negative.   Cardiovascular: Negative.   Gastrointestinal: Negative.   Endocrine: Negative.   Genitourinary: Negative.   Musculoskeletal: Positive for arthralgias and back pain.  Skin:       Calluses on foot and ingrown toe nails   Neurological: Positive for numbness.       Nerve pain in feet, legs, and upper extremities   Psychiatric/Behavioral: Negative.     Patient Active Problem List   Diagnosis Date Noted  . Osteoarthritis of hips, bilateral 07/02/2016  . Tobacco dependence 07/02/2016  . Depression 05/21/2016  . Acute pancreatitis 06/08/2015  . Chest pain of uncertain etiology 88/89/1694  . Alcohol abuse 06/08/2015  .  Type 2 diabetes mellitus ([place]) 06/08/2015  . Hypertension 06/08/2015  . Alcohol-induced acute pancreatitis     No Known Allergies  Prior to Admission medications   Medication Sig Start Date End Date Taking? Authorizing Provider  Blood Glucose Monitoring Suppl (TRUE METRIX AIR GLUCOSE METER) w/Device KIT 1 each by Does not apply route 3 (three) times daily before meals. 05/21/16  Yes [name], FNP  gabapentin (NEURONTIN) 300 MG capsule Take 1 capsule (300 mg total)  by mouth 4 (four) times daily. 06/04/17  Yes [name], FNP  glucose blood (TRUE METRIX BLOOD GLUCOSE TEST) test strip Use as instructed 01/08/17  Yes [name], FNP  Insulin Glargine (LANTUS) 100 UNIT/ML Solostar Pen Inject 20 Units daily at 10 pm into the skin. 04/14/17  Yes [name], FNP  Insulin Pen Needle 29G X 12.7MM MISC 1 each 4 (four) times daily -  before meals and at bedtime by Does not apply route. 04/14/17  Yes [name], FNP  Insulin Syringe-Needle U-100 (B-D INS SYR ULTRAFINE .3CC/31G) 31G X 5/16" 0.3 ML MISC 1 each by Does not apply route 4 (four) times daily -  before meals and at bedtime. 01/08/17  Yes [name], FNP  lisinopril (PRINIVIL,ZESTRIL) 5 MG tablet Take 1 tablet (5 mg total) by mouth daily. 06/04/17  Yes [name], FNP  metFORMIN (GLUCOPHAGE) 1000 MG tablet Take 1 tablet (1,000 mg total) by mouth 2 (two) times daily with a meal. 03/03/17  Yes [name], FNP  sildenafil (REVATIO) 20 MG tablet Take 1 tablet (20 mg total) by mouth daily as needed. 03/03/17  Yes [name], FNP  ergocalciferol (VITAMIN D2) 50000 units capsule Take 1 capsule (50,000 Units total) by mouth once a week. Patient not taking: Reported on 01/08/2017 11/25/16   [name], FNP    Past Medical, Surgical Family and Social History reviewed and updated.    Objective:   Today's Vitals   06/10/17 1450 06/10/17 1458  BP: 140/88 140/86  Pulse: 92   Resp: 16   Temp: 97.9 F (36.6 C)   TempSrc: Oral   SpO2: 100%   Weight: 157 lb (71.2 kg)   Height: 5' 11"  (1.803 m)   PainSc:  9     Wt Readings from Last 3 Encounters:  06/10/17 157 lb (71.2 kg)  03/02/17 152 lb (68.9 kg)  01/08/17 147 lb (66.7 kg)    Physical Exam  Constitutional: He is oriented to person, place, and time. He appears well-developed and well-nourished.  Eyes: Conjunctivae are normal. Pupils are equal, round, and reactive to light.  Neck: No thyromegaly present.   Cardiovascular: Normal rate, regular rhythm, normal heart sounds and intact distal pulses.  Pulmonary/Chest: Effort normal and breath sounds normal.  Lymphadenopathy:    He has no cervical adenopathy.  Neurological: He is alert and oriented to person, place, and time. Coordination normal.  Skin: Skin is warm and dry.  Feet-bilateral, thicken, discolored, overgrown toe-nails Callus present -ball of foot (right foot).  Psychiatric: He has a normal mood and affect. His behavior is normal. Judgment and thought content normal.   Assessment & Plan:  1. Essential hypertension, uncontrolled. Goal <130/90. Resuming HCTZ 12.5 mg once daily. Continue prescribed medication. We have discussed target BP range and blood pressure goal. I have advised patient to check BP regularly and to call us back or report to clinic if the numbers are consistently higher than 130/90. We discussed the  importance of compliance with medical therapy and DASH diet recommended, consequences of uncontrolled hypertension discussed.   2. Foot callus, patient suffers from uncontrolled diabetes which increases the risk for development of diabetic foot ulcers. Referring patient to podiatry for complete diabetic foot exam and foot care. Educated on the importance of foot surveillance at home to reduce risk for development of diabetic foot ulcers.    3. Type 2 diabetes mellitus with diabetic neuropathy, unspecified whether long term insulin use (HCC) A1C remains unchanged 9.5. Patient is encouraged to improve dietary choices in efforts to reduce intake of foods rich in carbohydrates and simple sugar. Make a commitment to engage in some form of physical activity with a goal of increasing activity to 150 minutes per week.   4. Insomnia due to medical condition, patient suffers from chronic persistent neuropathy which is contributing to neuropathy, will trial Trazodone 50-100 mg once daily at bedtime.   5. Neuropathy, chronic, persistent,  worsening, in spite of high dose management with Gabapentin. Patient warrants a neurology consult for further evaluation and treatment of neuropathic pain.    Meds ordered this encounter  Medications  . traZODone (DESYREL) 50 MG tablet    Sig: Take 1-2 tablets (50-100 mg total) by mouth at bedtime as needed for sleep.    Dispense:  30 tablet    Refill:  3    Order Specific Question:   Supervising Provider    Answer:   Tresa Garter [id number]  . hydrochlorothiazide (HYDRODIURIL) 25 MG tablet    Sig: Take 0.5 tablets (12.5 mg total) by mouth daily.    Dispense:  90 tablet    Refill:  3    Order Specific Question:   Supervising Provider    Answer:   Tresa Garter [id number]  . Insulin Glargine (LANTUS) 100 UNIT/ML Solostar Pen    Sig: Inject 20 Units into the skin daily at 10 pm.    Dispense:  27 mL    Refill:  3    Order Specific Question:   Supervising Provider    Answer:   Tresa Garter [id number]  . glucose blood (TRUE METRIX BLOOD GLUCOSE TEST) test strip    Sig: Use as instructed    Dispense:  100 each    Refill:  12    Order Specific Question:   Supervising Provider    Answer:   Tresa Garter [[id number]]    Deferred any work-up or evaluation of musculoskeletal symptoms to new PCP.  A total of 35 minutes spent, greater than 50 % of this time was spent reviewing prior medical history, reviewing medications and indications of treatment, prior labs and diagnostic tests, discussing current plan of treatment, health promotion, and goals of treatment.   Carroll Sage. Kenton Kingfisher, MSN, FNP-C [facility]  [address] Barbara Cower [place], [place] [address] [phone]

## 2017-06-10 NOTE — Patient Instructions (Addendum)
I am resuming hydrochlorothiazide 12.5 mg once daily to help with blood pressure control.  I am referring you to podiatry, [facility] at [address] evaluation of your ingrown toenail and calluses.   For neuropathy, I am referring you to [facility]. When you are scheduled for an appointment, they will notify you of which office to go to for your appointment.   The following is the phone number to the practice where you can call to establish primary care at [facility]:  [phone] [facility] [address].   Continue diabetes medications as prescribed.

## 2017-06-11 ENCOUNTER — Telehealth: Payer: Self-pay | Admitting: Family Medicine

## 2017-06-11 NOTE — Telephone Encounter (Signed)
Please process referrals to [facility] in [place]. Phone numbers included with referrals. Office note completed.  Godfrey PickKimberly S. Tiburcio PeaHarris, Stephen Bird, Stephen Bird [facility]  [address] [place], [place] [address] [phone]

## 2017-06-12 NOTE — Telephone Encounter (Signed)
Referrals faxed. 

## 2017-10-26 ENCOUNTER — Encounter: Payer: Self-pay | Admitting: Family Medicine

## 2017-11-23 IMAGING — CT CT CERVICAL SPINE W/O CM
4 series · 17 of 33 positions shown, 20 images · non-contrast
Comparison: None.

CLINICAL DATA: Fall and left-sided weakness.

EXAM:
CT CERVICAL SPINE WITHOUT CONTRAST
TECHNIQUE: Multidetector CT imaging of the cervical spine was performed without
intravenous contrast. Multiplanar CT image reconstructions were also
generated.

[Series 202: soft tissue, idose (2) · axial · 0.39mm/px · z∈[+1255,+1339]mm · 4 of 86 slices shown]
[im 15/86  soft-tissue]
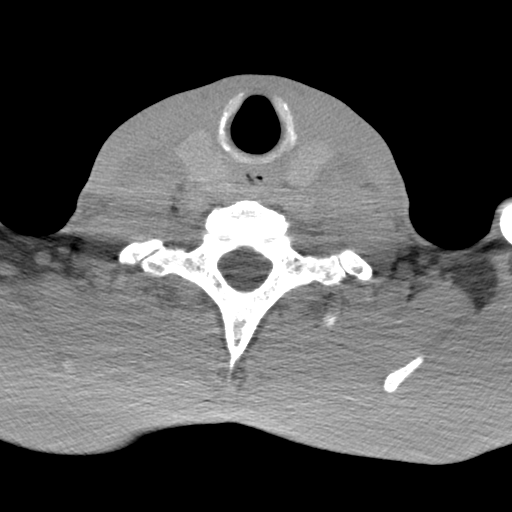
[im 29/86  soft-tissue]
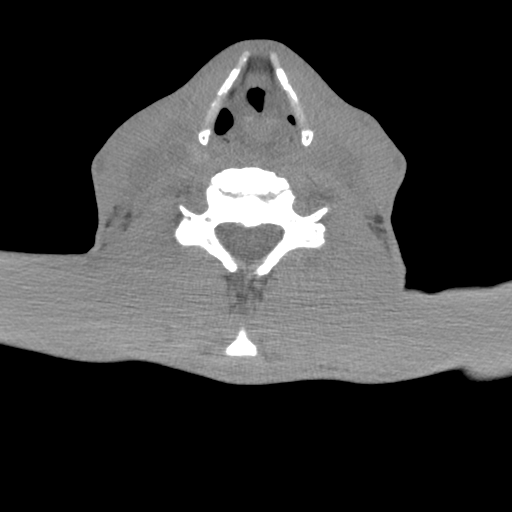
[im 43/86  soft-tissue]
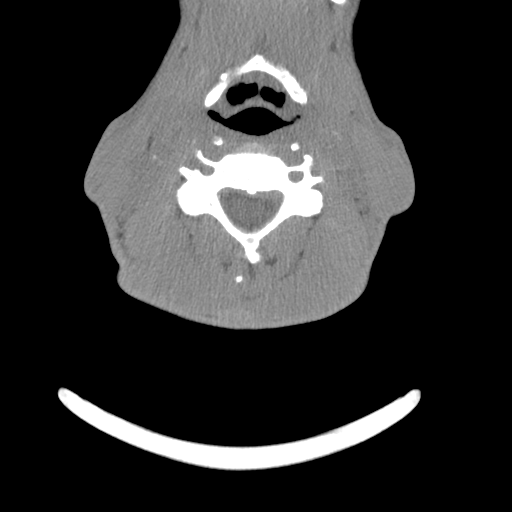
[im 57/86  soft-tissue]
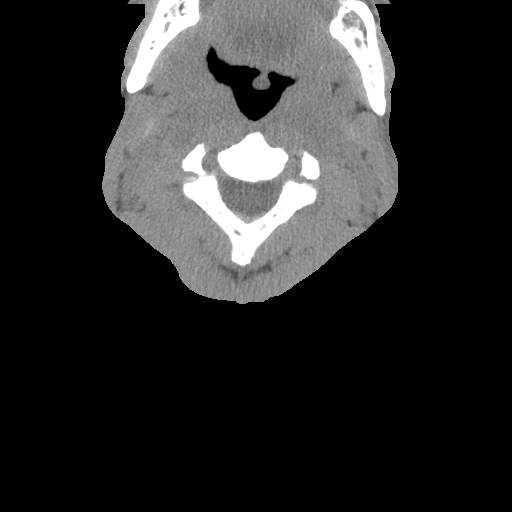

[Series 204: coronal, idose (2) · coronal · 0.34mm/px · 3 of 100 slices shown]
[im 20/100  bone]
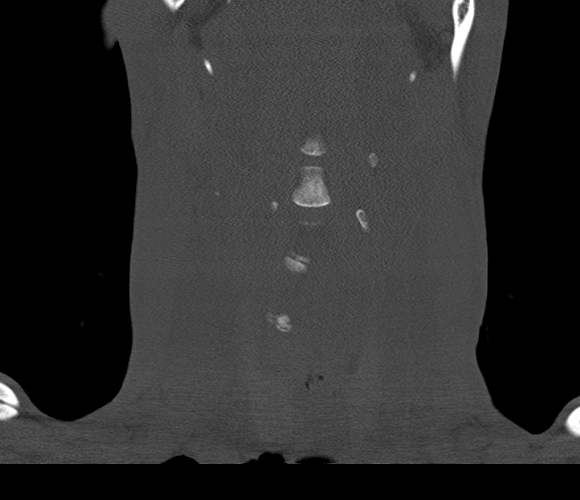
[im 40/100  bone]
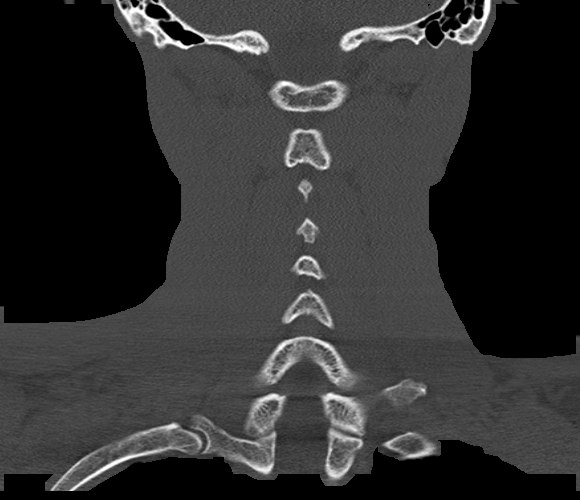
[im 60/100  bone]
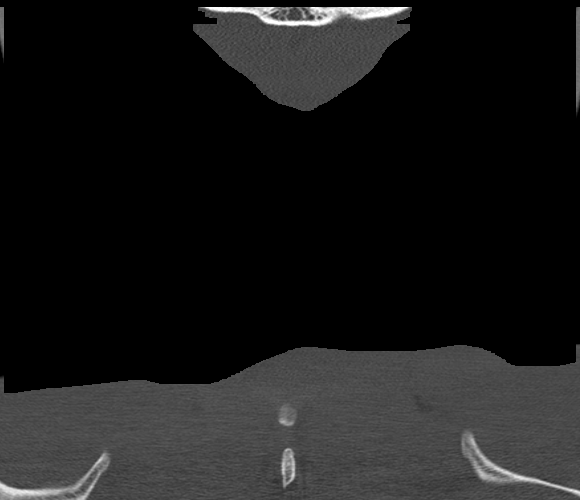

[Series 205: sagittal, idose (2) · sagittal · 0.34mm/px · 5 of 100 slices shown, 6 images]
[im 34/100  bone]
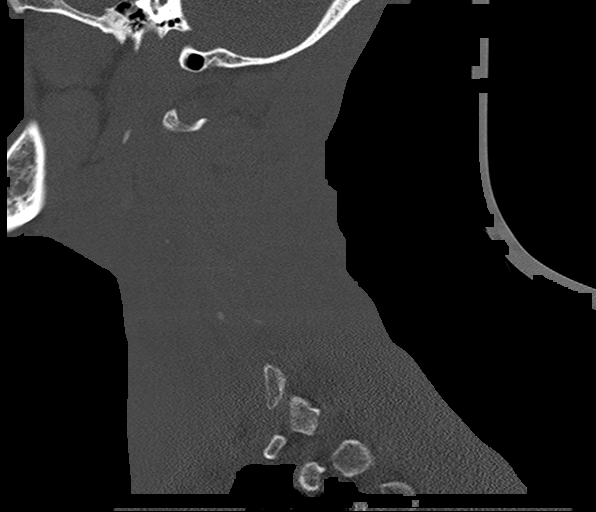
[im 42/100  bone]
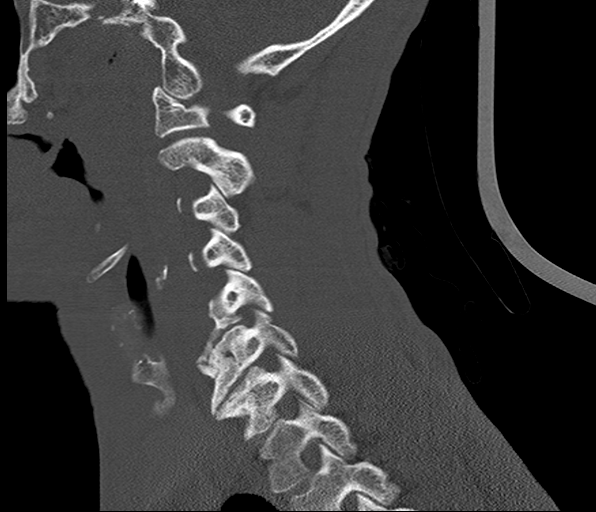
[im 50/100  soft-tissue]
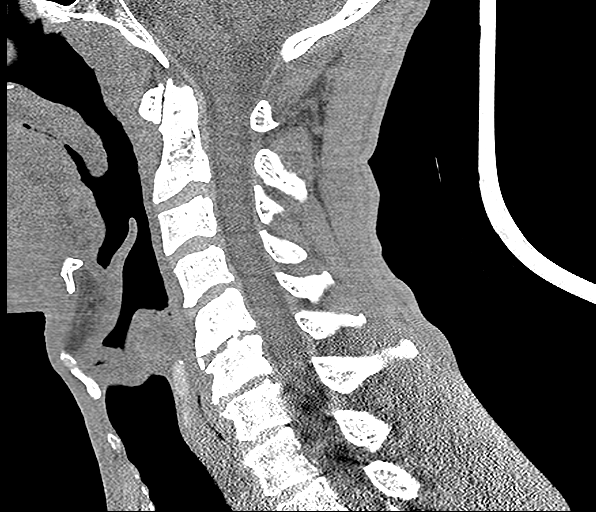
[im 50/100  bone]
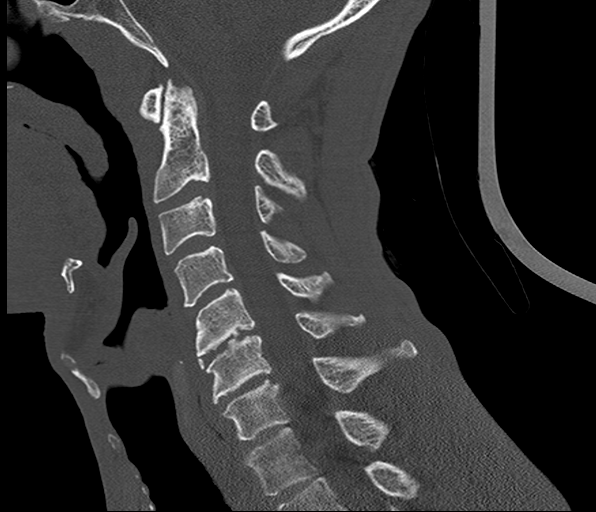
[im 58/100  bone]
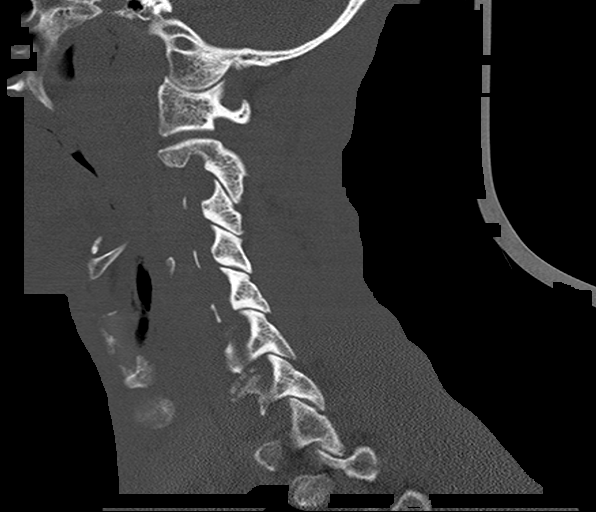
[im 67/100  bone]
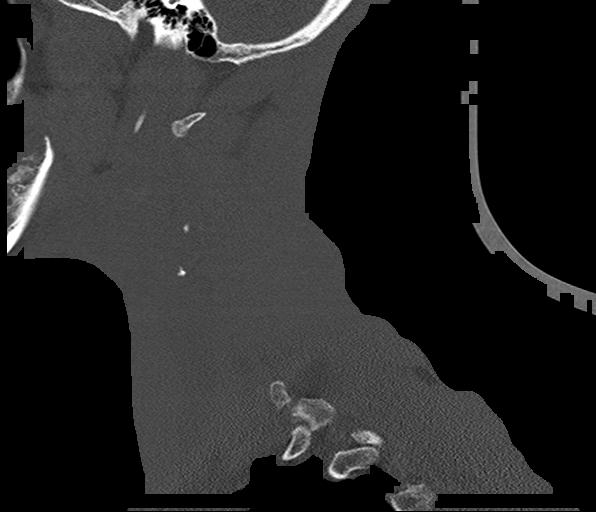

[Series 206: orthogonals, idose (2) · axial · 0.46mm/px · z∈[+1233,+1342]mm · 5 of 86 slices shown, 7 images]
[im 15/86  soft-tissue]
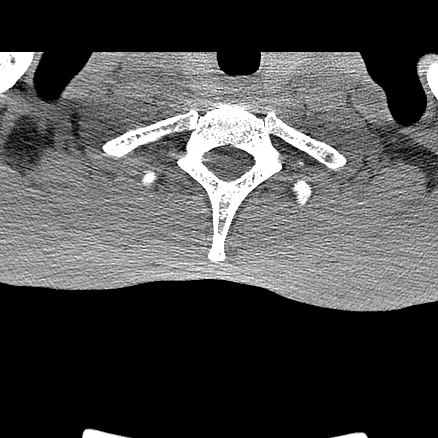
[im 15/86  bone]
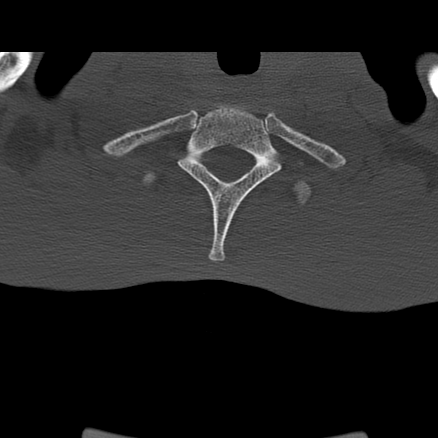
[im 29/86  bone]
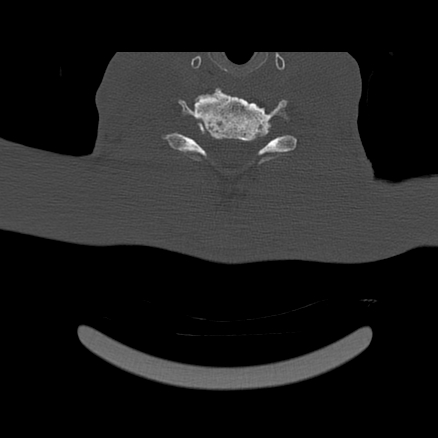
[im 43/86  bone]
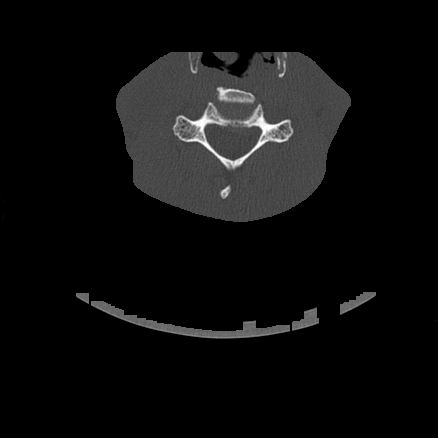
[im 57/86  bone]
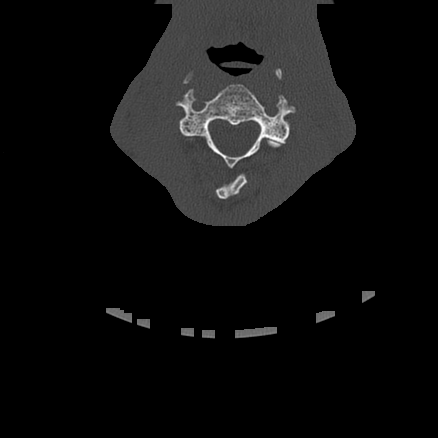
[im 71/86  soft-tissue]
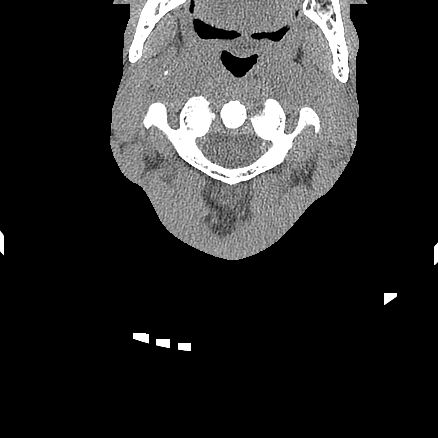
[im 71/86  bone]
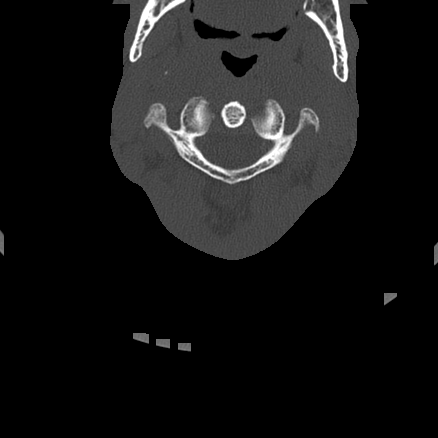

[17 of 33 positions shown; findings below may reference images not displayed]

FINDINGS: Alignment: Normal.

Skull base and vertebrae: Negative for a fracture or dislocation.

Soft tissues and spinal canal: No prevertebral fluid or swelling. No
visible canal hematoma.

Disc levels: Disc space narrowing and endplate changes at C5-C6.
Endplate changes and disc space narrowing at C6-C7. Vertebral body
heights are maintained. Left paracentral disc osteophyte complex at
C4-C5 possibly causing narrowing of the left neural foramen at this
level.

Upper chest: Patchy densities at lung apices may be related to
scarring. There appears to be some paraseptal emphysematous changes
at the lung apices but incompletely visualized.

Other: None.
IMPRESSION: No acute bone abnormality in cervical spine.

Degenerative disc disease in the cervical spine. Left paracentral
disc osteophyte complex at C4-C5.

Scarring and possible emphysematous changes at the lung apices.

## 2019-12-01 DEATH — deceased
